# Patient Record
Sex: Female | Born: 1985 | Race: Black or African American | Hispanic: No | Marital: Married | Smoking: Never smoker
Health system: Southern US, Community
[De-identification: ages and names within clinical notes are randomized; demographics above are authoritative.]

## PROBLEM LIST (undated history)

## (undated) DIAGNOSIS — D259 Leiomyoma of uterus, unspecified: Secondary | ICD-10-CM

## (undated) DIAGNOSIS — K759 Inflammatory liver disease, unspecified: Secondary | ICD-10-CM

## (undated) HISTORY — DX: Inflammatory liver disease, unspecified: K75.9

---

## 2020-04-23 ENCOUNTER — Ambulatory Visit (INDEPENDENT_AMBULATORY_CARE_PROVIDER_SITE_OTHER): Payer: Self-pay

## 2020-04-23 VITALS — Ht 68.0 in

## 2020-04-23 DIAGNOSIS — Z3A19 19 weeks gestation of pregnancy: Secondary | ICD-10-CM

## 2020-04-23 DIAGNOSIS — Z3400 Encounter for supervision of normal first pregnancy, unspecified trimester: Secondary | ICD-10-CM

## 2020-04-23 DIAGNOSIS — Z3490 Encounter for supervision of normal pregnancy, unspecified, unspecified trimester: Secondary | ICD-10-CM | POA: Insufficient documentation

## 2020-04-23 DIAGNOSIS — Z3A00 Weeks of gestation of pregnancy not specified: Secondary | ICD-10-CM

## 2020-04-23 NOTE — Progress Notes (Signed)
Patient was assessed and managed by nursing staff during this encounter. I have reviewed the chart and agree with the documentation and plan. I have also made any necessary editorial changes.  Verita Schneiders, MD 04/23/2020 11:45 AM

## 2020-04-23 NOTE — Progress Notes (Signed)
  Virtual Visit via Telephone Note  I connected with Baelyn Santellan on 04/23/20 at  9:00 AM EDT by telephone and verified that I am speaking with the correct person using two identifiers.  Location: Patient: Home Provider: Merla Riches    I discussed the limitations, risks, security and privacy concerns of performing an evaluation and management service by telephone and the availability of in person appointments. I also discussed with the patient that there may be a patient responsible charge related to this service. The patient expressed understanding and agreed to proceed.   History of Present Illness: PRENATAL INTAKE SUMMARY  Ms. Ruberg presents today New OB Nurse Interview.  OB History    Gravida  1   Para      Term      Preterm      AB      Living        SAB      IAB      Ectopic      Multiple      Live Births             I have reviewed the patient's medical, obstetrical, social, and family histories, medications, and available lab results.  SUBJECTIVE She has no unusual complaints   Observations/Objective: Initial nurse interview for history/labs (New OB)  EDD: 09/15/2020 GA: [redacted]w[redacted]d G1/P0 FHT: non face to face interview  GENERAL APPEARANCE: non face to face interview  Assessment and Plan: Normal pregnancy Prenatal care: [facility] at [place]  Labs/exam to be completed at next visit Download Babyscripts Sign up for Mychart  PHQ9 = 7 - pt declined counseling services  GAD= 1   Follow Up Instructions:   I discussed the assessment and treatment plan with the patient. The patient was provided an opportunity to ask questions and all were answered. The patient agreed with the plan and demonstrated an understanding of the instructions.   The patient was advised to call back or seek an in-person evaluation if the symptoms worsen or if the condition fails to improve as anticipated.  I provided 15 minutes of non-face-to-face time during this  encounter.   Maryruth Eve, CMA

## 2020-04-30 ENCOUNTER — Other Ambulatory Visit (HOSPITAL_COMMUNITY)
Admission: RE | Admit: 2020-04-30 | Discharge: 2020-04-30 | Disposition: A | Discharge status: Home / Self Care | Payer: Self-pay | Source: Ambulatory Visit | Attending: Obstetrics | Admitting: Obstetrics

## 2020-04-30 ENCOUNTER — Other Ambulatory Visit: Payer: Self-pay

## 2020-04-30 ENCOUNTER — Ambulatory Visit (INDEPENDENT_AMBULATORY_CARE_PROVIDER_SITE_OTHER): Payer: Self-pay | Admitting: Obstetrics

## 2020-04-30 ENCOUNTER — Encounter: Payer: Self-pay | Admitting: Obstetrics

## 2020-04-30 VITALS — BP 113/78 | HR 85 | Wt 192.0 lb

## 2020-04-30 DIAGNOSIS — Z3480 Encounter for supervision of other normal pregnancy, unspecified trimester: Secondary | ICD-10-CM | POA: Insufficient documentation

## 2020-04-30 DIAGNOSIS — D259 Leiomyoma of uterus, unspecified: Secondary | ICD-10-CM

## 2020-04-30 DIAGNOSIS — O3410 Maternal care for benign tumor of corpus uteri, unspecified trimester: Secondary | ICD-10-CM

## 2020-04-30 NOTE — Progress Notes (Signed)
Pt declines genetic testing and afp today.  Pt complains of back and pelvic pain.

## 2020-04-30 NOTE — Progress Notes (Signed)
Subjective:    Kerri Black is being seen today for her first obstetrical visit.  This is a planned pregnancy. She is at [redacted]w[redacted]d gestation. Her obstetrical history is significant for none. Relationship with FOB: spouse, living together. Patient does not intend to breast feed. Pregnancy history fully reviewed.  The information documented in the HPI was reviewed and verified.  Menstrual History: OB History    Gravida  1   Para      Term      Preterm      AB      Living        SAB      IAB      Ectopic      Multiple      Live Births               Patient's last menstrual period was 12/10/2019.    Past Medical History:  Diagnosis Date  . Hepatitis    hep b per pt    History reviewed. No pertinent surgical history.  (Not in a hospital admission)  No Known Allergies  Social History   Tobacco Use  . Smoking status: Never Smoker  . Smokeless tobacco: Never Used  Substance Use Topics  . Alcohol use: Never    History reviewed. No pertinent family history.   Review of Systems Constitutional: negative for weight loss Gastrointestinal: negative for vomiting Genitourinary:negative for genital lesions and vaginal discharge and dysuria Musculoskeletal:negative for back pain Behavioral/Psych: negative for abusive relationship, depression, illegal drug usage and tobacco use    Objective:    BP 113/78   Pulse 85   Wt 192 lb (87.1 kg)   LMP 12/10/2019   BMI 29.19 kg/m  General Appearance:    Alert, cooperative, no distress, appears stated age  Head:    Normocephalic, without obvious abnormality, atraumatic  Eyes:    PERRL, conjunctiva/corneas clear, EOM's intact, fundi    benign, both eyes  Ears:    Normal TM's and external ear canals, both ears  Nose:   Nares normal, septum midline, mucosa normal, no drainage    or sinus tenderness  Throat:   Lips, mucosa, and tongue normal; teeth and gums normal  Neck:   Supple, symmetrical, trachea midline, no  adenopathy;    thyroid:  no enlargement/tenderness/nodules; no carotid   bruit or JVD  Back:     Symmetric, no curvature, ROM normal, no CVA tenderness  Lungs:     Clear to auscultation bilaterally, respirations unlabored  Chest Wall:    No tenderness or deformity   Heart:    Regular rate and rhythm, S1 and S2 normal, no murmur, rub   or gallop  Breast Exam:    No tenderness, masses, or nipple abnormality  Abdomen:     Soft, non-tender, bowel sounds active all four quadrants,    no masses, no organomegaly  Genitalia:    Normal female without lesion, discharge or tenderness  Extremities:   Extremities normal, atraumatic, no cyanosis or edema  Pulses:   2+ and symmetric all extremities  Skin:   Skin color, texture, turgor normal, no rashes or lesions  Lymph nodes:   Cervical, supraclavicular, and axillary nodes normal  Neurologic:   CNII-XII intact, normal strength, sensation and reflexes    throughout      Lab Review Urine pregnancy test Labs reviewed yes Radiologic studies reviewed no Assessment:    Pregnancy at [redacted]w[redacted]d weeks    Plan:     1. Supervision  of other normal pregnancy, antepartum Rx: - Cytology - PAP( [place]) - Cervicovaginal ancillary only( [place]) - CBC/D/Plt+RPR+Rh+ABO+Rub Ab... - Culture, OB Urine  2. Uterine fibroid in pregnancy - clinically stable  Prenatal vitamins.  Counseling provided regarding continued use of seat belts, cessation of alcohol consumption, smoking or use of illicit drugs; infection precautions i.e., influenza/TDAP immunizations, toxoplasmosis,CMV, parvovirus, listeria and varicella; workplace safety, exercise during pregnancy; routine dental care, safe medications, sexual activity, hot tubs, saunas, pools, travel, caffeine use, fish and methlymercury, potential toxins, hair treatments, varicose veins Weight gain recommendations per IOM guidelines reviewed: underweight/BMI< 18.5--> gain 28 - 40 lbs; normal weight/BMI 18.5 - 24.9-->  gain 25 - 35 lbs; overweight/BMI 25 - 29.9--> gain 15 - 25 lbs; obese/BMI >30->gain  11 - 20 lbs Problem list reviewed and updated. FIRST/CF mutation testing/NIPT/QUAD SCREEN/fragile X/Ashkenazi Jewish population testing/Spinal muscular atrophy discussed: declined. Role of ultrasound in pregnancy discussed; fetal survey: declined. Amniocentesis discussed: not indicated.   Orders Placed This Encounter  Procedures  . Culture, OB Urine  . CBC/D/Plt+RPR+Rh+ABO+Rub Ab...    Follow up in 4 weeks.  I have spent a total of 25 minutes of face-to-face time, excluding clinical staff time, reviewing notes and preparing to see patient, ordering tests and/or medications, and counseling the patient.  Shelly Bombard, MD 04/30/2020 12:00 PM

## 2020-05-02 LAB — URINE CULTURE, OB REFLEX

## 2020-05-02 LAB — CULTURE, OB URINE

## 2020-05-03 LAB — CERVICOVAGINAL ANCILLARY ONLY
Bacterial Vaginitis (gardnerella): NEGATIVE
Candida Glabrata: NEGATIVE
Candida Vaginitis: NEGATIVE
Chlamydia: NEGATIVE
Comment: NEGATIVE
Comment: NEGATIVE
Comment: NEGATIVE
Comment: NEGATIVE
Comment: NEGATIVE
Comment: NORMAL
Neisseria Gonorrhea: NEGATIVE
Trichomonas: NEGATIVE

## 2020-05-03 LAB — CYTOLOGY - PAP
Comment: NEGATIVE
Diagnosis: NEGATIVE
High risk HPV: NEGATIVE

## 2020-05-04 ENCOUNTER — Other Ambulatory Visit: Payer: Self-pay | Admitting: Obstetrics

## 2020-05-04 LAB — CBC/D/PLT+RPR+RH+ABO+RUB AB...
Antibody Screen: NEGATIVE
Basophils Absolute: 0 10*3/uL (ref 0.0–0.2)
Basos: 0 %
EOS (ABSOLUTE): 0.1 10*3/uL (ref 0.0–0.4)
Eos: 1 %
HCV Ab: 0.2 s/co ratio (ref 0.0–0.9)
HIV Screen 4th Generation wRfx: NONREACTIVE
Hematocrit: 38.6 % (ref 34.0–46.6)
Hemoglobin: 13.3 g/dL (ref 11.1–15.9)
Immature Grans (Abs): 0 10*3/uL (ref 0.0–0.1)
Immature Granulocytes: 0 %
Lymphocytes Absolute: 2.4 10*3/uL (ref 0.7–3.1)
Lymphs: 29 %
MCH: 30.4 pg (ref 26.6–33.0)
MCHC: 34.5 g/dL (ref 31.5–35.7)
MCV: 88 fL (ref 79–97)
Monocytes Absolute: 0.7 10*3/uL (ref 0.1–0.9)
Monocytes: 9 %
Neutrophils Absolute: 5.1 10*3/uL (ref 1.4–7.0)
Neutrophils: 61 %
Platelets: 284 10*3/uL (ref 150–450)
RBC: 4.37 x10E6/uL (ref 3.77–5.28)
RDW: 13.4 % (ref 11.7–15.4)
RPR Ser Ql: NONREACTIVE
Rh Factor: POSITIVE
Rubella Antibodies, IGG: 7.88 index (ref >0.99)
WBC: 8.3 10*3/uL (ref 3.4–10.8)

## 2020-05-04 LAB — HEPATITIS B SURFACE AG, CONFIRM: HBsAG Confirmation: POSITIVE — AB

## 2020-05-04 LAB — HCV INTERPRETATION

## 2020-05-28 ENCOUNTER — Ambulatory Visit (INDEPENDENT_AMBULATORY_CARE_PROVIDER_SITE_OTHER): Payer: Self-pay

## 2020-05-28 ENCOUNTER — Other Ambulatory Visit: Payer: Self-pay

## 2020-05-28 VITALS — BP 112/82 | HR 74 | Wt 202.0 lb

## 2020-05-28 DIAGNOSIS — Z3A24 24 weeks gestation of pregnancy: Secondary | ICD-10-CM

## 2020-05-28 DIAGNOSIS — Z3400 Encounter for supervision of normal first pregnancy, unspecified trimester: Secondary | ICD-10-CM

## 2020-05-28 NOTE — Progress Notes (Signed)
   LOW-RISK PREGNANCY OFFICE VISIT  Patient name: Kerri Black MRN [id number]  Date of birth: 1985/08/17 Chief Complaint:   Routine Prenatal Visit  Subjective:   Kerri Black is a 35 y.o. G1P0 female at [redacted]w[redacted]d with an Estimated Date of Delivery: 09/15/20 being seen today for ongoing management of a low-risk pregnancy aeb has Encounter for supervision of normal pregnancy, antepartum on their problem list.  Patient presents today with c/o vaginal pressure and back pain.  Patient endorses fetal movement. Patient denies abdominal cramping or contractions.  Patient denies vaginal concerns including abnormal discharge, leaking of fluid, and bleeding.  Contractions: Not present. Vag. Bleeding: None.  Movement: Present.  Reviewed past medical,surgical, social, obstetrical and family history as well as problem list, medications and allergies.  Objective   Vitals:   05/28/20 1016  BP: 112/82  Pulse: 74  Weight: 202 lb (91.6 kg)  Body mass index is 30.71 kg/m.  Total Weight Gain:30 lb (13.6 kg)         Physical Examination:   General appearance: Well appearing, and in no distress  Mental status: Alert, oriented to person, place, and time  Skin: Warm & dry  Cardiovascular: Normal heart rate noted  Respiratory: Normal respiratory effort, no distress  Abdomen: Soft, gravid, nontender, LGA with Fundal Height: 30 cm and known fibroids  Pelvic: Cervical exam deferred           Extremities: Edema: None  Fetal Status: Fetal Heart Rate (bpm): 156  Movement: Present   No results found for this or any previous visit (from the past 24 hour(s)).  Assessment & Plan:  Low-risk pregnancy of a 35 y.o., G1P0 at [redacted]w[redacted]d with an Estimated Date of Delivery: 09/15/20   1. Supervision of normal first pregnancy, antepartum -Anticipatory guidance for upcoming appts. -Patient to schedule next appt in 4 weeks for an in-person visit. -Reviewed Glucola appt preparation including fasting the night  before and morning of.   -Discussed anticipated office time of 2.5-3 hours.  -Reviewed blood draw procedures and labs which also include check of iron level, HIV, and RPR. -Reviewed [place] which was normal, but incomplete. -Request and order sent for anatomy completion to [facility]. -Patient states she did not receive [place] pics of infant.  Encouraged to advocate for pics at next visit.   2. [redacted] weeks gestation of pregnancy -Doing well overall. -Reviewed complaints and reassured that normal physiological changes that will worsen as pregnancy progresses. -Discussed usage of maternity support band/belt.     Meds: No orders of the defined types were placed in this encounter.  Labs/procedures today:  Lab Orders  No laboratory test(s) ordered today     Reviewed: Preterm labor symptoms and general obstetric precautions including but not limited to vaginal bleeding, contractions, leaking of fluid and fetal movement were reviewed in detail with the patient.  All questions were answered.  Follow-up: Return in about 4 weeks (around 06/25/2020) for [place] with GTT.  No orders of the defined types were placed in this encounter.  Maryann Conners MSN, CNM 05/28/2020

## 2020-05-28 NOTE — Patient Instructions (Addendum)
Glucose Tolerance Test Why am I having this test? The glucose tolerance test (GTT) is done to check how your body processes sugar (glucose). This is one of several tests used to diagnose diabetes (diabetes mellitus). Your health care provider may recommend this test if you:  Have a family history of diabetes.  Are obese.  Have infections that keep coming back.  Have had a lot of wounds that did not heal quickly, especially on your legs and feet.  Are a woman and have a history of giving birth to very large babies or a history of repeated fetal loss (stillbirth).  Have had high glucose levels in your urine or blood: ? During a past pregnancy. ? After a heart attack, surgery, or prolonged periods of high stress. What is being tested? This test measures the amount of glucose in your blood at different times during a period of 2 hours. This indicates how well your body is able to process glucose. What kind of sample is taken? Blood samples are required for this test. They are usually collected by inserting a needle into a blood vessel.   How do I prepare for this test?  For 3 days before your test, eat normally. Have plenty of carbohydrate-rich foods.  Follow instructions from your health care provider about: ? Eating or drinking restrictions on the day of the test. You may be asked to not eat or drink anything other than water (fast) starting 8-12 hours before the test. ? Changing or stopping your regular medicines. Some medicines may interfere with this test. Tell a health care provider about:  All medicines you are taking, including vitamins, herbs, eye drops, creams, and over-the-counter medicines.  Any blood disorders you have.  Any surgeries you have had.  Any medical conditions you have.  Whether you are pregnant or may be pregnant. What happens during the test? First, your blood glucose will be measured. This is referred to as your fasting blood glucose, since you  fasted before the test. Then, you will drink a glucose solution that contains a specific amount of glucose. Your blood glucose will be measured again 1 and 2 hours after drinking the solution. This test takes 2 hours to complete. You will need to stay at the testing location during this time. During the testing period:  Do not eat or drink anything other than the glucose solution. You will be allowed to drink water.  Do not exercise.  Do not use any products that contain nicotine or tobacco. These products include cigarettes, chewing tobacco, and vaping devices, such as e-cigarettes. If you need help quitting, ask your health care provider. The testing procedure may vary among health care providers and hospitals. How are the results reported? Your test results will be reported as values. These will be given as milligrams of glucose per deciliter of blood (mg/dL) or millimoles per liter (mmol/L). Your health care provider will compare your results to normal ranges that were established after testing a large group of people (reference ranges). Reference ranges may vary among labs and hospitals. For this test, common reference ranges are:  Fasting: less than 110 mg/dL (6.1 mmol/L).  1 hour after drinking glucose: less than 180 mg/dL (10.0 mmol/L).  2 hours after drinking glucose: less than 140 mg/dL (7.8 mmol/L). What do the results mean? Results that are within the reference ranges are considered normal, meaning that your glucose levels are well controlled. Results higher than the reference ranges may mean that you recently experienced  stress, such as from an injury or a sudden (acute) condition like a heart attack or stroke, or that you have:  Diabetes.  Cushing syndrome.  Tumors such as pheochromocytoma or glucagonoma.  Kidney failure.  Pancreatitis.  Hyperthyroidism.  An infection. Talk with your health care provider about what your results mean. Questions to ask your health care  provider Ask your health care provider, or the department that is doing the test:  When will my results be ready?  How will I get my results?  What are my treatment options?  What other tests do I need?  What are my next steps? Summary  The GTT is done to check how your body processes glucose. This is one of several tests used to diagnose diabetes.  This test measures the amount of glucose in your blood at different times during a period of 2 hours. This indicates how well your body is able to process glucose.  Talk with your health care provider about what your results mean. This information is not intended to replace advice given to you by your health care provider. Make sure you discuss any questions you have with your health care provider. Document Revised: 09/17/2019 Document Reviewed: 09/17/2019 Elsevier Patient Education  2021 [employer].

## 2020-05-28 NOTE — Progress Notes (Signed)
[place] results are in Media.

## 2020-06-25 ENCOUNTER — Other Ambulatory Visit: Payer: Self-pay

## 2020-06-25 ENCOUNTER — Ambulatory Visit (INDEPENDENT_AMBULATORY_CARE_PROVIDER_SITE_OTHER): Payer: Self-pay | Admitting: Obstetrics and Gynecology

## 2020-06-25 VITALS — BP 112/73 | HR 94 | Wt 206.0 lb

## 2020-06-25 DIAGNOSIS — B1910 Unspecified viral hepatitis B without hepatic coma: Secondary | ICD-10-CM

## 2020-06-25 DIAGNOSIS — Z3400 Encounter for supervision of normal first pregnancy, unspecified trimester: Secondary | ICD-10-CM

## 2020-06-25 DIAGNOSIS — O98419 Viral hepatitis complicating pregnancy, unspecified trimester: Secondary | ICD-10-CM

## 2020-06-25 DIAGNOSIS — Z23 Encounter for immunization: Secondary | ICD-10-CM

## 2020-06-25 MED ORDER — CYCLOBENZAPRINE HCL 10 MG PO TABS: 10.0000 mg | ORAL_TABLET | Freq: Three times a day (TID) | ORAL | 1 refills | Status: DC | PRN

## 2020-06-25 NOTE — Progress Notes (Signed)
Pt states increase in back pain and pelvic pressure.  Pt states that she has a support belt but has not used very much.   PHQ-9 score 0 today.

## 2020-06-25 NOTE — Progress Notes (Signed)
   PRENATAL VISIT NOTE  Subjective:  Kerri Black is a 35 y.o. G1P0 at [redacted]w[redacted]d being seen today for ongoing prenatal care.  She is currently monitored for the following issues for this low-risk pregnancy and has Encounter for supervision of normal pregnancy, antepartum and Hepatitis B affecting pregnancy on their problem list.  Patient reports no complaints.  Contractions: Not present. Vag. Bleeding: None.  Movement: Present. Denies leaking of fluid.   The following portions of the patient's history were reviewed and updated as appropriate: allergies, current medications, past family history, past medical history, past social history, past surgical history and problem list.   Objective:   Vitals:   06/25/20 0843  BP: 112/73  Pulse: 94  Weight: 206 lb (93.4 kg)    Fetal Status: Fetal Heart Rate (bpm): 145 Fundal Height: 30 cm Movement: Present     General:  Alert, oriented and cooperative. Patient is in no acute distress.  Skin: Skin is warm and dry. No rash noted.   Cardiovascular: Normal heart rate noted  Respiratory: Normal respiratory effort, no problems with respiration noted  Abdomen: Soft, gravid, appropriate for gestational age.  Pain/Pressure: Present     Pelvic: Cervical exam deferred        Extremities: Normal range of motion.     Mental Status: Normal mood and affect. Normal behavior. Normal judgment and thought content.   Assessment and Plan:  Pregnancy: G1P0 at [redacted]w[redacted]d 1. Supervision of normal first pregnancy, antepartum  - Glucose Tolerance, 2 Hours w/1 Hour - CBC - HIV Antibody (routine testing w rflx) - RPR - Comprehensive metabolic panel - HAV, HBV Panel - Had US done at [facility] @ 17 weeks, however this was not done as an anatomy scan. Another order placed for anatomy scan and growth.     Hepatitis B  If HepBsAg is positive-->Check HBeAg, anti-HBe, HBV DNA, LFT's  If HBV DNA > 2 x 105 or HBeAg +, or elevated LFTs-->refer to hepatologist  If HBV  DNA > 2 x 104, HBeAg-, and abnl LFT's-->refer to hepatologist   Check LFT's q 3 months up to 6 months pp  Check HBV DNA q 3 months, if LFT's are up or at 26-28 wks-->if increasing here, consider referral for treatment  In high risk women who are negative for HepBsAg-->check HepBsAb and HepBCore Ab-if no evidence of immunity or prior exposure then vaccinate patient   Preterm labor symptoms and general obstetric precautions including but not limited to vaginal bleeding, contractions, leaking of fluid and fetal movement were reviewed in detail with the patient. Please refer to After Visit Summary for other counseling recommendations.   Return in about 2 weeks (around 07/09/2020), or MD only.  Future Appointments  Date Time Provider [place]  07/09/2020  8:30 AM Shelly Bombard, MD [place] None    Noni Saupe, NP

## 2020-06-27 DIAGNOSIS — B1910 Unspecified viral hepatitis B without hepatic coma: Secondary | ICD-10-CM | POA: Insufficient documentation

## 2020-06-27 DIAGNOSIS — O98419 Viral hepatitis complicating pregnancy, unspecified trimester: Secondary | ICD-10-CM | POA: Insufficient documentation

## 2020-06-29 LAB — CBC
Hematocrit: 34.5 % (ref 34.0–46.6)
Hemoglobin: 11.5 g/dL (ref 11.1–15.9)
MCH: 29 pg (ref 26.6–33.0)
MCHC: 33.3 g/dL (ref 31.5–35.7)
MCV: 87 fL (ref 79–97)
Platelets: 309 10*3/uL (ref 150–450)
RBC: 3.97 x10E6/uL (ref 3.77–5.28)
RDW: 13 % (ref 11.7–15.4)
WBC: 8.2 10*3/uL (ref 3.4–10.8)

## 2020-06-29 LAB — COMPREHENSIVE METABOLIC PANEL
ALT: 10 IU/L (ref 0–32)
AST: 12 IU/L (ref 0–40)
Albumin/Globulin Ratio: 1.3 (ref 1.2–2.2)
Albumin: 3.5 g/dL — ABNORMAL LOW (ref 3.8–4.8)
Alkaline Phosphatase: 96 IU/L (ref 44–121)
BUN/Creatinine Ratio: 13 (ref 9–23)
BUN: 7 mg/dL (ref 6–20)
Bilirubin Total: <0.2 mg/dL (ref 0.0–1.2)
CO2: 18 mmol/L — ABNORMAL LOW (ref 20–29)
Calcium: 9 mg/dL (ref 8.7–10.2)
Chloride: 104 mmol/L (ref 96–106)
Creatinine, Ser: 0.56 mg/dL — ABNORMAL LOW (ref 0.57–1.00)
Globulin, Total: 2.8 g/dL (ref 1.5–4.5)
Glucose: 149 mg/dL — ABNORMAL HIGH (ref 65–99)
Potassium: 3.6 mmol/L (ref 3.5–5.2)
Sodium: 136 mmol/L (ref 134–144)
Total Protein: 6.3 g/dL (ref 6.0–8.5)
eGFR: 124 mL/min/{1.73_m2} (ref >59)

## 2020-06-29 LAB — HBCIGM: Hep B C IgM: NEGATIVE

## 2020-06-29 LAB — HAV, HBV PANEL
Hep B Core Total Ab: POSITIVE — AB
Hep B Surface Ab, Qual: NONREACTIVE
Hepatitis B Surface Ag: POSITIVE — AB
hep A Total Ab: POSITIVE — AB

## 2020-06-29 LAB — HIV ANTIBODY (ROUTINE TESTING W REFLEX): HIV Screen 4th Generation wRfx: NONREACTIVE

## 2020-06-29 LAB — HEPATITIS A ANTIBODY, IGM: Hep A IgM: NEGATIVE

## 2020-06-29 LAB — GLUCOSE TOLERANCE, 2 HOURS W/ 1HR
Glucose, 1 hour: 149 mg/dL (ref 65–179)
Glucose, 2 hour: 136 mg/dL (ref 65–152)
Glucose, Fasting: 79 mg/dL (ref 65–91)

## 2020-06-29 LAB — RPR: RPR Ser Ql: NONREACTIVE

## 2020-07-09 ENCOUNTER — Encounter: Payer: Self-pay | Admitting: Obstetrics

## 2020-07-09 ENCOUNTER — Ambulatory Visit (INDEPENDENT_AMBULATORY_CARE_PROVIDER_SITE_OTHER): Payer: Self-pay | Admitting: Obstetrics and Gynecology

## 2020-07-09 ENCOUNTER — Encounter: Payer: Self-pay | Admitting: Obstetrics and Gynecology

## 2020-07-09 ENCOUNTER — Other Ambulatory Visit: Payer: Self-pay

## 2020-07-09 VITALS — BP 121/76 | HR 96 | Wt 213.0 lb

## 2020-07-09 DIAGNOSIS — O98419 Viral hepatitis complicating pregnancy, unspecified trimester: Secondary | ICD-10-CM

## 2020-07-09 DIAGNOSIS — Z3400 Encounter for supervision of normal first pregnancy, unspecified trimester: Secondary | ICD-10-CM

## 2020-07-09 DIAGNOSIS — Z3A30 30 weeks gestation of pregnancy: Secondary | ICD-10-CM

## 2020-07-09 DIAGNOSIS — B1910 Unspecified viral hepatitis B without hepatic coma: Secondary | ICD-10-CM

## 2020-07-09 NOTE — Progress Notes (Signed)
   PRENATAL VISIT NOTE  Subjective:  Kerri Black is a 35 y.o. G1P0 at [redacted]w[redacted]d being seen today for ongoing prenatal care.  She is currently monitored for the following issues for this high-risk pregnancy and has Encounter for supervision of normal pregnancy, antepartum; Hepatitis B affecting pregnancy; and [redacted] weeks gestation of pregnancy on their problem list.  Patient doing well with no acute concerns today. She reports no complaints.  Contractions: Not present. Vag. Bleeding: None.  Movement: Present. Denies leaking of fluid.   Some previous labwork on the HepB panel had not resulted, panel reordered today  The following portions of the patient's history were reviewed and updated as appropriate: allergies, current medications, past family history, past medical history, past social history, past surgical history and problem list. Problem list updated.  Objective:   Vitals:   07/09/20 1114  BP: 121/76  Pulse: 96  Weight: 213 lb (96.6 kg)    Fetal Status: Fetal Heart Rate (bpm): 154   Movement: Present     General:  Alert, oriented and cooperative. Patient is in no acute distress.  Skin: Skin is warm and dry. No rash noted.   Cardiovascular: Normal heart rate noted  Respiratory: Normal respiratory effort, no problems with respiration noted  Abdomen: Soft, gravid, appropriate for gestational age.  Pain/Pressure: Absent     Pelvic: Cervical exam deferred        Extremities: Normal range of motion.  Edema: None  Mental Status:  Normal mood and affect. Normal behavior. Normal judgment and thought content.   Assessment and Plan:  Pregnancy: G1P0 at [redacted]w[redacted]d  1. Supervision of normal first pregnancy, antepartum Continue routine care  2. [redacted] weeks gestation of pregnancy   3. Hepatitis B affecting pregnancy See 06/25/20 note - Hepatitis B DNA, Ultraquantitative, PCR - Hepatitis B E Antibody - Hepatitis B E Antigen  Preterm labor symptoms and general obstetric precautions  including but not limited to vaginal bleeding, contractions, leaking of fluid and fetal movement were reviewed in detail with the patient.  Please refer to After Visit Summary for other counseling recommendations.   Return in about 2 weeks (around 07/23/2020) for ROB, in person.   Lynnda Shields, MD [facility]

## 2020-07-09 NOTE — Progress Notes (Signed)
ROB  [redacted]w[redacted]d  CC: None

## 2020-07-12 LAB — HEPATITIS B E ANTIGEN: Hep B E Ag: NEGATIVE

## 2020-07-12 LAB — HEPATITIS B DNA, ULTRAQUANTITATIVE, PCR: HBV DNA SERPL PCR-ACNC: <10 IU/mL

## 2020-07-12 LAB — HEPATITIS B E ANTIBODY: Hep B E Ab: NEGATIVE

## 2020-07-23 ENCOUNTER — Other Ambulatory Visit: Payer: Self-pay

## 2020-07-23 ENCOUNTER — Ambulatory Visit (INDEPENDENT_AMBULATORY_CARE_PROVIDER_SITE_OTHER): Payer: Self-pay | Admitting: Obstetrics and Gynecology

## 2020-07-23 ENCOUNTER — Encounter: Payer: Self-pay | Admitting: Obstetrics and Gynecology

## 2020-07-23 VITALS — BP 110/77 | HR 84 | Wt 213.4 lb

## 2020-07-23 DIAGNOSIS — Z3400 Encounter for supervision of normal first pregnancy, unspecified trimester: Secondary | ICD-10-CM

## 2020-07-23 DIAGNOSIS — B1910 Unspecified viral hepatitis B without hepatic coma: Secondary | ICD-10-CM

## 2020-07-23 DIAGNOSIS — O98419 Viral hepatitis complicating pregnancy, unspecified trimester: Secondary | ICD-10-CM

## 2020-07-23 NOTE — Patient Instructions (Signed)
[email or web address]">  Third Trimester of Pregnancy  The third trimester of pregnancy is from week 28 through week 40. This is months 7 through 9. The third trimester is a time when the unborn baby (fetus) is growing rapidly. At the end of the ninth month, the fetus is about 20inches long and weighs 6-10 pounds. Body changes during your third trimester During the third trimester, your body will continue to go through many changes.The changes vary and generally return to normal after your baby is born. Physical changes Your weight will continue to increase. You can expect to gain 25-35 pounds (11-16 kg) by the end of the pregnancy if you begin pregnancy at a normal weight. If you are underweight, you can expect to gain 28-40 lb (about 13-18 kg), and if you are overweight, you can expect to gain 15-25 lb (about 7-11 kg). You may begin to get stretch marks on your hips, abdomen, and breasts. Your breasts will continue to grow and may hurt. A yellow fluid (colostrum) may leak from your breasts. This is the first milk you are producing for your baby. You may have changes in your hair. These can include thickening of your hair, rapid growth, and changes in texture. Some people also have hair loss during or after pregnancy, or hair that feels dry or thin. Your belly button may stick out. You may notice more swelling in your hands, face, or ankles. Health changes You may have heartburn. You may have constipation. You may develop hemorrhoids. You may develop swollen, bulging veins (varicose veins) in your legs. You may have increased body aches in the pelvis, back, or thighs. This is due to weight gain and increased hormones that are relaxing your joints. You may have increased tingling or numbness in your hands, arms, and legs. The skin on your abdomen may also feel numb. You may feel short of breath because of your expanding uterus. Other  changes You may urinate more often because the fetus is moving lower into your pelvis and pressing on your bladder. You may have more problems sleeping. This may be caused by the size of your abdomen, an increased need to urinate, and an increase in your body's metabolism. You may notice the fetus "dropping," or moving lower in your abdomen (lightening). You may have increased vaginal discharge. You may notice that you have pain around your pelvic bone as your uterus distends. Follow these instructions at home: Medicines Follow your health care provider's instructions regarding medicine use. Specific medicines may be either safe or unsafe to take during pregnancy. Do not take any medicines unless approved by your health care provider. Take a prenatal vitamin that contains at least 600 micrograms (mcg) of folic acid. Eating and drinking Eat a healthy diet that includes fresh fruits and vegetables, whole grains, good sources of protein such as meat, eggs, or tofu, and low-fat dairy products. Avoid raw meat and unpasteurized juice, milk, and cheese. These carry germs that can harm you and your baby. Eat 4 or 5 small meals rather than 3 large meals a day. You may need to take these actions to prevent or treat constipation: Drink enough fluid to keep your urine pale yellow. Eat foods that are high in fiber, such as beans, whole grains, and fresh fruits and vegetables. Limit foods that are high in fat and processed sugars, such as fried or sweet foods. Activity Exercise only as directed by your health care provider. Most people can continue their usual exercise routine during pregnancy. Try  to exercise for 30 minutes at least 5 days a week. Stop exercising if you experience contractions in the uterus. Stop exercising if you develop pain or cramping in the lower abdomen or lower back. Avoid heavy lifting. Do not exercise if it is very hot or humid or if you are at a high altitude. If you choose to,  you may continue to have sex unless your health care provider tells you not to. Relieving pain and discomfort Take frequent breaks and rest with your legs raised (elevated) if you have leg cramps or low back pain. Take warm sitz baths to soothe any pain or discomfort caused by hemorrhoids. Use hemorrhoid cream if your health care provider approves. Wear a supportive bra to prevent discomfort from breast tenderness. If you develop varicose veins: Wear support hose as told by your health care provider. Elevate your feet for 15 minutes, 3-4 times a day. Limit salt in your diet. Safety Talk to your health care provider before traveling far distances. Do not use hot tubs, steam rooms, or saunas. Wear your seat belt at all times when driving or riding in a car. Talk with your health care provider if someone is verbally or physically abusive to you. Preparing for birth To prepare for the arrival of your baby: Take prenatal classes to understand, practice, and ask questions about labor and delivery. Visit the hospital and tour the maternity area. Purchase a rear-facing car seat and make sure you know how to install it in your car. Prepare the baby's room or sleeping area. Make sure to remove all pillows and stuffed animals from the baby's crib to prevent suffocation. General instructions Avoid cat litter boxes and soil used by cats. These carry germs that can cause birth defects in the baby. If you have a cat, ask someone to clean the litter box for you. Do not douche or use tampons. Do not use scented sanitary pads. Do not use any products that contain nicotine or tobacco, such as cigarettes, e-cigarettes, and chewing tobacco. If you need help quitting, ask your health care provider. Do not use any herbal remedies, illegal drugs, or medicines that were not prescribed to you. Chemicals in these products can harm your baby. Do not drink alcohol. You will have more frequent prenatal exams during the  third trimester. During a routine prenatal visit, your health care provider will do a physical exam, perform tests, and discuss your overall health. Keep all follow-up visits. This is important. Where to find more information American Pregnancy Association: americanpregnancy.[place] and Gynecologists: PoolDevices.com.pt Office on [employer]: KeywordPortfolios.com.br Contact a health care provider if you have: A fever. Mild pelvic cramps, pelvic pressure, or nagging pain in your abdominal area or lower back. Vomiting or diarrhea. Bad-smelling vaginal discharge or foul-smelling urine. Pain when you urinate. A headache that does not go away when you take medicine. Visual changes or see spots in front of your eyes. Get help right away if: Your water breaks. You have regular contractions less than 5 minutes apart. You have spotting or bleeding from your vagina. You have severe abdominal pain. You have difficulty breathing. You have chest pain. You have fainting spells. You have not felt your baby move for the time period told by your health care provider. You have new or increased pain, swelling, or redness in an arm or leg. Summary The third trimester of pregnancy is from week 28 through week 40 (months 7 through 9). You may have more problems  sleeping. This can be caused by the size of your abdomen, an increased need to urinate, and an increase in your body's metabolism. You will have more frequent prenatal exams during the third trimester. Keep all follow-up visits. This is important. This information is not intended to replace advice given to you by your health care provider. Make sure you discuss any questions you have with your healthcare provider. Document Revised: 05/28/2019 Document Reviewed: 04/03/2019 Elsevier Patient Education  2022 [employer].

## 2020-07-23 NOTE — Progress Notes (Signed)
Subjective:  Kerri Black is a 35 y.o. G1P0 at 1w2dbeing seen today for ongoing prenatal care.  She is currently monitored for the following issues for this high-risk pregnancy and has Encounter for supervision of normal pregnancy, antepartum and Hepatitis B affecting pregnancy on their problem list.  Patient reports no complaints.  Contractions: Not present. Vag. Bleeding: None.  Movement: Present. Denies leaking of fluid.   The following portions of the patient's history were reviewed and updated as appropriate: allergies, current medications, past family history, past medical history, past social history, past surgical history and problem list. Problem list updated.  Objective:   Vitals:   07/23/20 0811  BP: 110/77  Pulse: 84  Weight: 213 lb 6.4 oz (96.8 kg)    Fetal Status: Fetal Heart Rate (bpm): 140   Movement: Present     General:  Alert, oriented and cooperative. Patient is in no acute distress.  Skin: Skin is warm and dry. No rash noted.   Cardiovascular: Normal heart rate noted  Respiratory: Normal respiratory effort, no problems with respiration noted  Abdomen: Soft, gravid, appropriate for gestational age. Pain/Pressure: Absent     Pelvic:  Cervical exam deferred        Extremities: Normal range of motion.  Edema: None  Mental Status: Normal mood and affect. Normal behavior. Normal judgment and thought content.   Urinalysis:      Assessment and Plan:  Pregnancy: G1P0 at 326w2d1. Hepatitis B affecting pregnancy Stable Repeat labs in Sept  2. Supervision of normal first pregnancy, antepartum Stable  Preterm labor symptoms and general obstetric precautions including but not limited to vaginal bleeding, contractions, leaking of fluid and fetal movement were reviewed in detail with the patient. Please refer to After Visit Summary for other counseling recommendations.  Return in about 2 weeks (around 08/06/2020) for face to face, any provider, OB  visit.   ErChancy MilroyMD

## 2020-07-23 NOTE — Progress Notes (Signed)
   Patient in clinic for [place] visit. Normal vitals, fetal heart tones 140. Patient reported last fetal movement was last night, however as nurse listening to fetal heart tones the baby was kicking. Patient would like to discuss her most recent ultrasound report completed on 06/17/2020 [facility].   Derl Barrow, RN

## 2020-07-30 ENCOUNTER — Ambulatory Visit (INDEPENDENT_AMBULATORY_CARE_PROVIDER_SITE_OTHER): Payer: Self-pay | Admitting: Obstetrics & Gynecology

## 2020-07-30 VITALS — BP 120/81 | HR 86 | Wt 217.0 lb

## 2020-07-30 DIAGNOSIS — Z3400 Encounter for supervision of normal first pregnancy, unspecified trimester: Secondary | ICD-10-CM

## 2020-07-30 NOTE — Progress Notes (Signed)
   PRENATAL VISIT NOTE  Subjective:  Kerri Black is a 35 y.o. G1P0 at 56w2dbeing seen today for ongoing prenatal care.  She is currently monitored for the following issues for this low-risk pregnancy and has Encounter for supervision of normal pregnancy, antepartum and Hepatitis B affecting pregnancy on their problem list.  Patient reports  no FM 3 days .  Contractions: Not present. Vag. Bleeding: None.  Movement: (!) Decreased. Denies leaking of fluid.   The following portions of the patient's history were reviewed and updated as appropriate: allergies, current medications, past family history, past medical history, past social history, past surgical history and problem list.   Objective:   Vitals:   07/30/20 0953  BP: 120/81  Pulse: 86  Weight: 217 lb (98.4 kg)    Fetal Status: Fetal Heart Rate (bpm): NST   Movement: (!) Decreased     General:  Alert, oriented and cooperative. Patient is in no acute distress.  Skin: Skin is warm and dry. No rash noted.   Cardiovascular: Normal heart rate noted  Respiratory: Normal respiratory effort, no problems with respiration noted  Abdomen: Soft, gravid, appropriate for gestational age.  Pain/Pressure: Present     Pelvic: Cervical exam deferred        Extremities: Normal range of motion.  Edema: None  Mental Status: Normal mood and affect. Normal behavior. Normal judgment and thought content.   Assessment and Plan:  Pregnancy: G1P0 at 312w2d. Supervision of normal first pregnancy, antepartum NST reactive and normal FM noted  Preterm labor symptoms and general obstetric precautions including but not limited to vaginal bleeding, contractions, leaking of fluid and fetal movement were reviewed in detail with the patient. Please refer to After Visit Summary for other counseling recommendations.   Return in about 1 week (around 08/06/2020).  Future Appointments  Date Time Provider [place]8/05/2020  8:55 AM HaShelly BombardMD [facility] None    JaEmeterio ReeveMD

## 2020-07-30 NOTE — Progress Notes (Signed)
Pt came into the office today c/o decreased fetal movement for 3 days. She was placed on the NST.

## 2020-08-06 ENCOUNTER — Encounter: Payer: Self-pay | Admitting: Obstetrics

## 2020-08-06 ENCOUNTER — Ambulatory Visit (INDEPENDENT_AMBULATORY_CARE_PROVIDER_SITE_OTHER): Payer: Self-pay | Admitting: Obstetrics

## 2020-08-06 ENCOUNTER — Other Ambulatory Visit: Payer: Self-pay

## 2020-08-06 VITALS — BP 120/83 | HR 88 | Wt 220.0 lb

## 2020-08-06 DIAGNOSIS — O3410 Maternal care for benign tumor of corpus uteri, unspecified trimester: Secondary | ICD-10-CM

## 2020-08-06 DIAGNOSIS — Z3400 Encounter for supervision of normal first pregnancy, unspecified trimester: Secondary | ICD-10-CM

## 2020-08-06 DIAGNOSIS — B181 Chronic viral hepatitis B without delta-agent: Secondary | ICD-10-CM

## 2020-08-06 DIAGNOSIS — D259 Leiomyoma of uterus, unspecified: Secondary | ICD-10-CM

## 2020-08-06 NOTE — Progress Notes (Signed)
Subjective:  Kerri Black is a 34 y.o. G1P0 at 33w2dbeing seen today for ongoing prenatal care.  She is currently monitored for the following issues for this low-risk pregnancy and has Encounter for supervision of normal pregnancy, antepartum and Hepatitis B affecting pregnancy on their problem list.  Patient reports no complaints.  Contractions: Not present. Vag. Bleeding: None.  Movement: Present. Denies leaking of fluid.   The following portions of the patient's history were reviewed and updated as appropriate: allergies, current medications, past family history, past medical history, past social history, past surgical history and problem list. Problem list updated.  Objective:   Vitals:   08/06/20 0852  BP: 120/83  Pulse: 88  Weight: 220 lb (99.8 kg)    Fetal Status:     Movement: Present     General:  Alert, oriented and cooperative. Patient is in no acute distress.  Skin: Skin is warm and dry. No rash noted.   Cardiovascular: Normal heart rate noted  Respiratory: Normal respiratory effort, no problems with respiration noted  Abdomen: Soft, gravid, appropriate for gestational age. Pain/Pressure: Present     Pelvic:  Cervical exam deferred        Extremities: Normal range of motion.  Edema: None  Mental Status: Normal mood and affect. Normal behavior. Normal judgment and thought content.   Urinalysis:      Assessment and Plan:  Pregnancy: G1P0 at 332w2d Preterm labor symptoms and general obstetric precautions including but not limited to vaginal bleeding, contractions, leaking of fluid and fetal movement were reviewed in detail with the patient. Please refer to After Visit Summary for other counseling recommendations.   Return in about 2 weeks (around 08/20/2020) for R[place]  HaShelly BombardMD  08/06/20

## 2020-08-20 ENCOUNTER — Encounter: Payer: Self-pay | Admitting: Obstetrics

## 2020-08-20 ENCOUNTER — Other Ambulatory Visit: Payer: Self-pay

## 2020-08-20 ENCOUNTER — Other Ambulatory Visit (HOSPITAL_COMMUNITY)
Admission: RE | Admit: 2020-08-20 | Discharge: 2020-08-20 | Disposition: A | Discharge status: Home / Self Care | Payer: Self-pay | Source: Ambulatory Visit | Attending: Obstetrics | Admitting: Obstetrics

## 2020-08-20 ENCOUNTER — Ambulatory Visit (INDEPENDENT_AMBULATORY_CARE_PROVIDER_SITE_OTHER): Payer: Self-pay | Admitting: Obstetrics

## 2020-08-20 VITALS — BP 106/74 | HR 88 | Wt 220.5 lb

## 2020-08-20 DIAGNOSIS — D259 Leiomyoma of uterus, unspecified: Secondary | ICD-10-CM

## 2020-08-20 DIAGNOSIS — B181 Chronic viral hepatitis B without delta-agent: Secondary | ICD-10-CM

## 2020-08-20 DIAGNOSIS — Z3400 Encounter for supervision of normal first pregnancy, unspecified trimester: Secondary | ICD-10-CM | POA: Insufficient documentation

## 2020-08-20 DIAGNOSIS — O3410 Maternal care for benign tumor of corpus uteri, unspecified trimester: Secondary | ICD-10-CM

## 2020-08-20 NOTE — Progress Notes (Signed)
Pt presents for ROB, GBS, GC/CT.

## 2020-08-20 NOTE — Progress Notes (Signed)
Subjective:  Kerri Black is a 35 y.o. G1P0 at 13w2dbeing seen today for ongoing prenatal care.  She is currently monitored for the following issues for this low-risk pregnancy and has Encounter for supervision of normal pregnancy, antepartum and Hepatitis B affecting pregnancy on their problem list.  Patient reports no complaints.  Contractions: Not present. Vag. Bleeding: None.  Movement: Present. Denies leaking of fluid.   The following portions of the patient's history were reviewed and updated as appropriate: allergies, current medications, past family history, past medical history, past social history, past surgical history and problem list. Problem list updated.  Objective:   Vitals:   08/20/20 0900  BP: 106/74  Pulse: 88  Weight: 220 lb 8 oz (100 kg)    Fetal Status:     Movement: Present     General:  Alert, oriented and cooperative. Patient is in no acute distress.  Skin: Skin is warm and dry. No rash noted.   Cardiovascular: Normal heart rate noted  Respiratory: Normal respiratory effort, no problems with respiration noted  Abdomen: Soft, gravid, appropriate for gestational age. Pain/Pressure: Present     Pelvic:  Cervical exam deferred        Extremities: Normal range of motion.  Edema: None  Mental Status: Normal mood and affect. Normal behavior. Normal judgment and thought content.   Urinalysis:      Assessment and Plan:  Pregnancy: G1P0 at 327w2d1. Supervision of normal first pregnancy, antepartum Rx: - Cervicovaginal ancillary only( [place]) - Strep Gp B NAA  2. Uterine fibroid in pregnancy - clinically stable  3. [place]  Preterm labor symptoms and general obstetric precautions including but not limited to vaginal bleeding, contractions, leaking of fluid and fetal movement were reviewed in detail with the patient. Please refer to After Visit Summary for other counseling recommendations.   Return in about 1 week (around  08/27/2020) for ROB.   HaShelly BombardMD  08/20/20

## 2020-08-22 LAB — STREP GP B NAA: Strep Gp B NAA: NEGATIVE

## 2020-08-23 ENCOUNTER — Inpatient Hospital Stay (HOSPITAL_BASED_OUTPATIENT_CLINIC_OR_DEPARTMENT_OTHER): Payer: Self-pay

## 2020-08-23 ENCOUNTER — Encounter (HOSPITAL_COMMUNITY): Payer: Self-pay | Admitting: Obstetrics and Gynecology

## 2020-08-23 ENCOUNTER — Other Ambulatory Visit: Payer: Self-pay

## 2020-08-23 ENCOUNTER — Inpatient Hospital Stay (HOSPITAL_COMMUNITY)
Admission: AD | Admit: 2020-08-23 | Discharge: 2020-08-23 | Disposition: A | Discharge status: Home / Self Care | Payer: Self-pay | Attending: Obstetrics and Gynecology | Admitting: Obstetrics and Gynecology

## 2020-08-23 DIAGNOSIS — O3413 Maternal care for benign tumor of corpus uteri, third trimester: Secondary | ICD-10-CM | POA: Insufficient documentation

## 2020-08-23 DIAGNOSIS — D259 Leiomyoma of uterus, unspecified: Secondary | ICD-10-CM

## 2020-08-23 DIAGNOSIS — Z3689 Encounter for other specified antenatal screening: Secondary | ICD-10-CM

## 2020-08-23 DIAGNOSIS — Z3A36 36 weeks gestation of pregnancy: Secondary | ICD-10-CM

## 2020-08-23 DIAGNOSIS — O368130 Decreased fetal movements, third trimester, not applicable or unspecified: Secondary | ICD-10-CM

## 2020-08-23 DIAGNOSIS — Z369 Encounter for antenatal screening, unspecified: Secondary | ICD-10-CM

## 2020-08-23 LAB — CERVICOVAGINAL ANCILLARY ONLY
Bacterial Vaginitis (gardnerella): NEGATIVE
Candida Glabrata: NEGATIVE
Candida Vaginitis: NEGATIVE
Chlamydia: NEGATIVE
Comment: NEGATIVE
Comment: NEGATIVE
Comment: NEGATIVE
Comment: NEGATIVE
Comment: NEGATIVE
Comment: NORMAL
Neisseria Gonorrhea: NEGATIVE
Trichomonas: NEGATIVE

## 2020-08-23 IMAGING — US US MFM FETAL BPP W/O NON-STRESS
1 series · 12 of 27 positions shown · non-contrast
Comparison: none

[Series 1: us mfm fetal bpp w/o non-stress · 27 acquisitions, 12 frames shown]
[im 2/27]
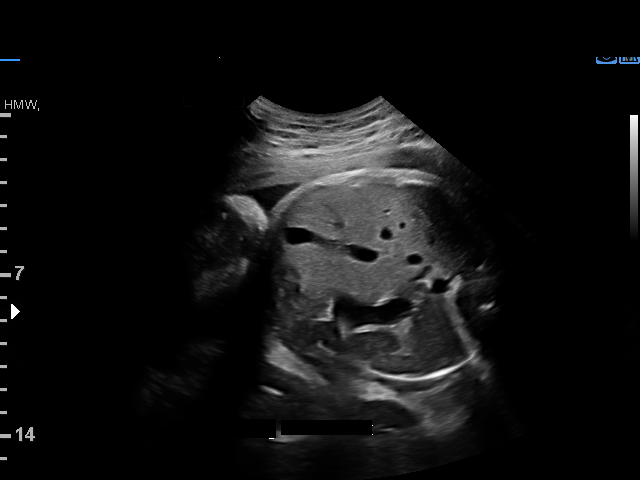
[im 4/27]
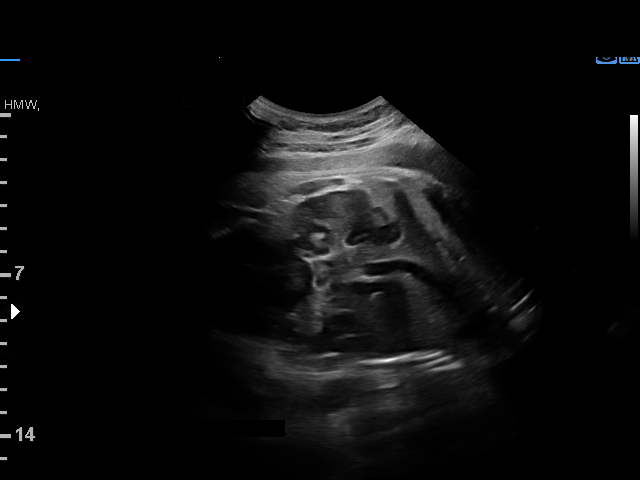
[im 6/27]
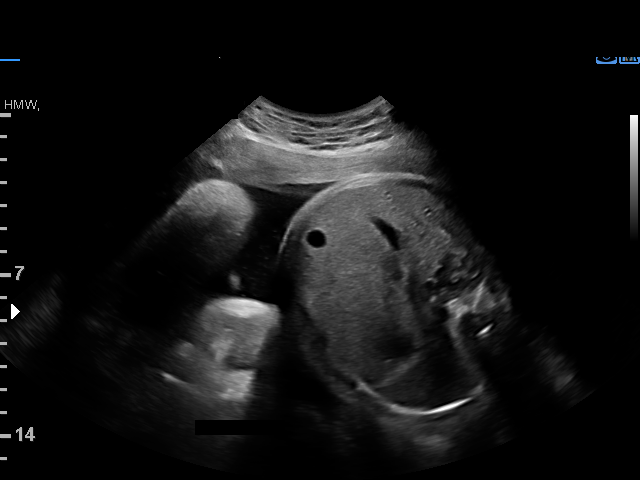
[im 8/27]
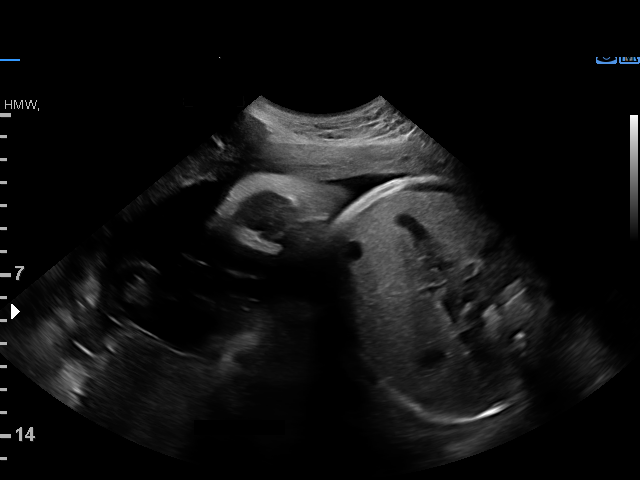
[im 11/27]
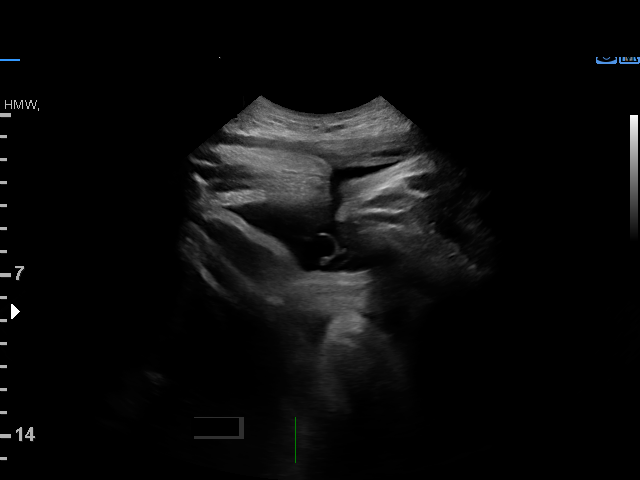
[im 13/27]
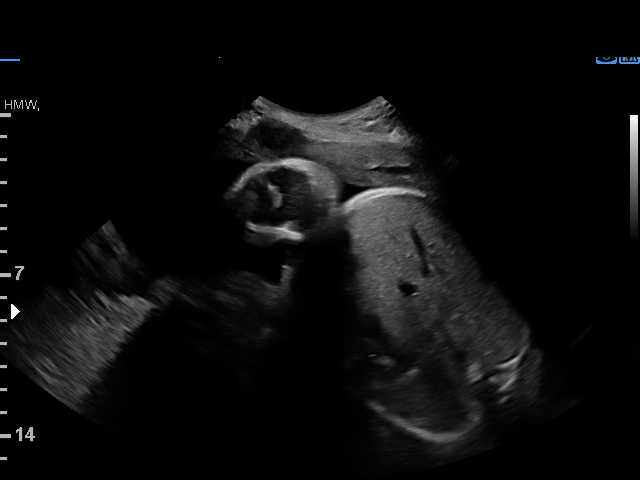
[im 15/27]
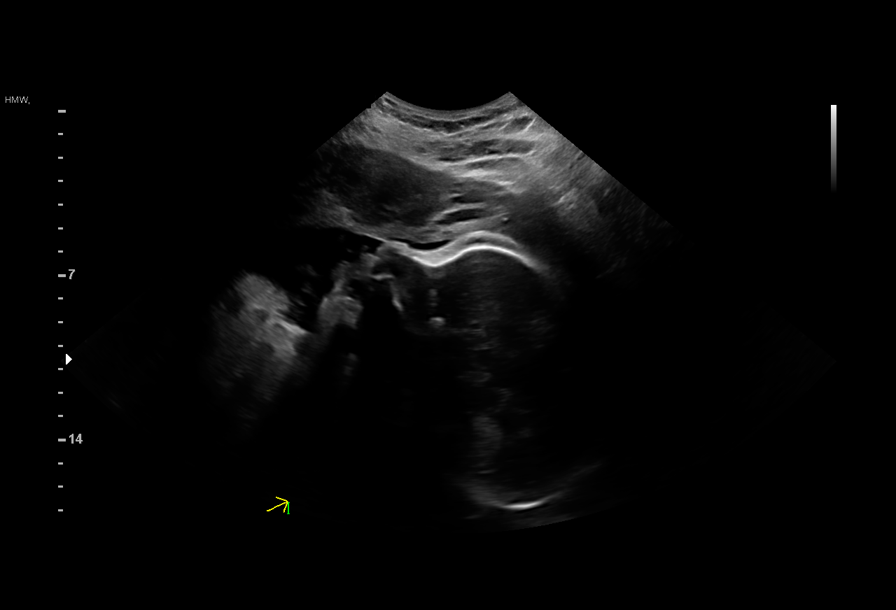
[im 17/27]
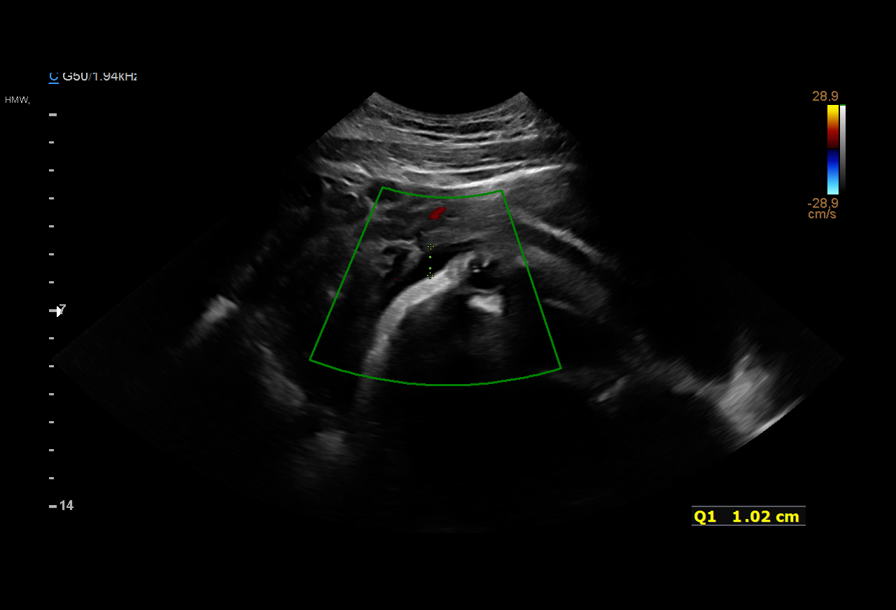
[im 20/27]
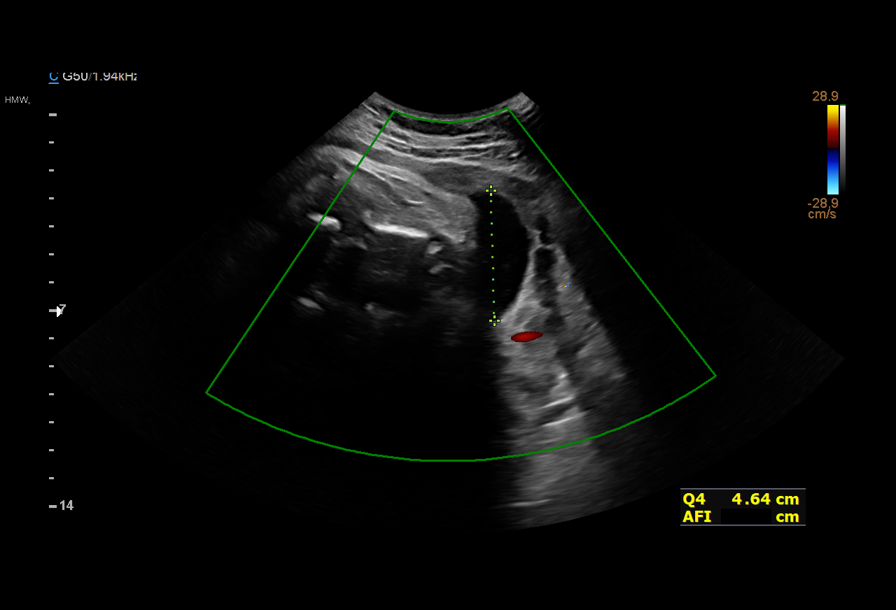
[im 22/27]
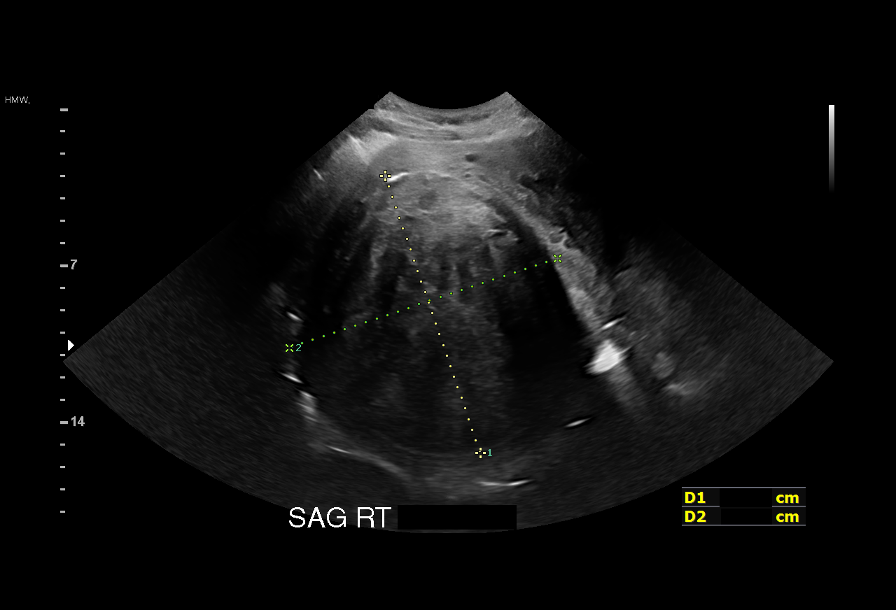
[im 24/27]
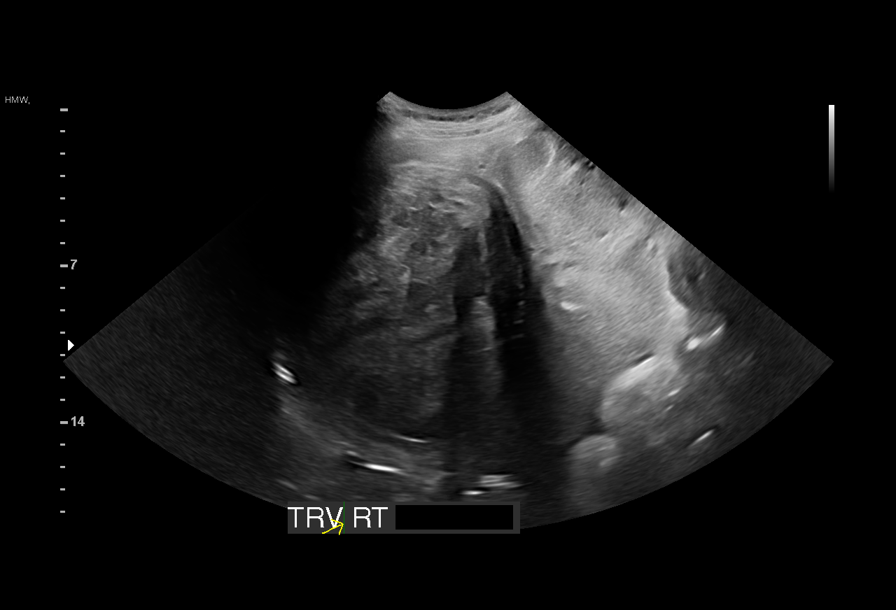
[im 26/27]
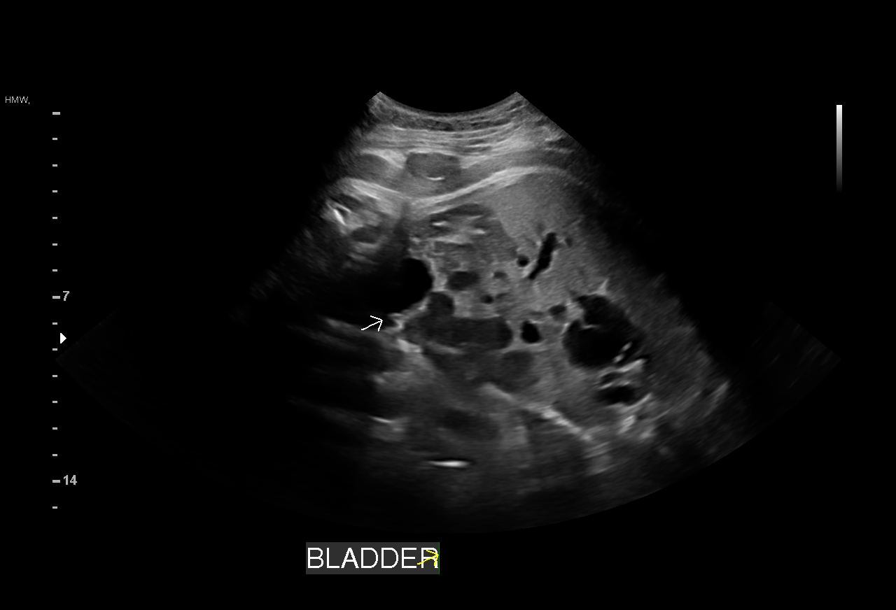

[12 of 27 positions shown; findings below may reference images not displayed]

Indications

 Decreased fetal movements, third trimester,    [AN]
 unspecified
 36 weeks gestation of pregnancy
 Encounter for antenatal screening,             [AN]
 unspecified
 Uterine fibroids affecting pregnancy in third  O34.13, [AN]
 trimester, antepartum
Fetal Evaluation

 Num Of Fetuses:         1
 Fetal Heart Rate(bpm):  140
 Cardiac Activity:       Observed
 Presentation:           Cephalic

 Amniotic Fluid
 AFI FV:      Within normal limits

 AFI Sum(cm)     %Tile       Largest Pocket(cm)
 14.5            54          6

 RUQ(cm)       RLQ(cm)       LUQ(cm)        LLQ(cm)
 1             4.6           6
Biophysical Evaluation

 Amniotic F.V:   Within normal limits       F. Tone:        Observed
 F. Movement:    Observed                   Score:          [DATE]
 F. Breathing:   Observed
OB History

 Gravidity:    1         Term:   0        Prem:   0        SAB:   0
 TOP:          0       Ectopic:  0        Living: 0
Gestational Age

 LMP:           36w 5d        Date:  [DATE]                 EDD:   [DATE]
 Best:          36w 5d     Det. By:  LMP  ([DATE])          EDD:   [DATE]
Anatomy

 Thoracic:              Appears normal         Bladder:                Appears normal
 Stomach:               Appears normal, left
                        sided
Myomas

 Site                     L(cm)      W(cm)      D(cm)       Location
 Right

 Blood Flow                  RI       PI       Comments

Impression

 Antenatal testing performed given maternal decreased fetal
 movement.
 The biophysical profile was [DATE] with good fetal movement and
 amniotic fluid volume.
Recommendations

 Continue weekly testing.

## 2020-08-23 NOTE — MAU Provider Note (Signed)
History   [id number]   Chief Complaint  Patient presents with   Decreased Fetal Movement    HPI Kerri Black is a 35 y.o. female  G1P0000 here with report of decreased fetal movement since today. Reports feeling the baby move 5 times in the last 2 hours. Baby is not moving as strong as she is used to. Denies abdominal pain, vaginal bleeding, LOF, or abdominal trauma.   Patient's last menstrual period was 12/10/2019.  OB History  Gravida Para Term Preterm AB Living  1 0 0 0 0 0  SAB IAB Ectopic Multiple Live Births  0 0 0 0 0    # Outcome Date GA Lbr Len/2nd Weight Sex Delivery Anes PTL Lv  1 Current             Past Medical History:  Diagnosis Date   Hepatitis    hep b per pt    History reviewed. No pertinent family history.  Social History   Socioeconomic History   Marital status: Married    Spouse name: Not on file   Number of children: Not on file   Years of education: Not on file   Highest education level: Not on file  Occupational History   Not on file  Tobacco Use   Smoking status: Never   Smokeless tobacco: Never  Vaping Use   Vaping Use: Never used  Substance and Sexual Activity   Alcohol use: Never   Drug use: Never   Sexual activity: Yes    Birth control/protection: None  Other Topics Concern   Not on file  Social History Narrative   Not on file   Social Determinants of Health   Financial Resource Strain: Not on file  Food Insecurity: Not on file  Transportation Needs: Not on file  Physical Activity: Not on file  Stress: Not on file  Social Connections: Not on file    No Known Allergies  No current facility-administered medications on file prior to encounter.   Current Outpatient Medications on File Prior to Encounter  Medication Sig Dispense Refill   Prenatal Vit-Fe Fumarate-FA (PRENATAL VITAMIN PO) Take by mouth.       Review of Systems  Constitutional: Negative.   Gastrointestinal: Negative.   Genitourinary: Negative.      Physical Exam   Vitals:   08/23/20 2233 08/23/20 2342  BP: 135/70 123/75  Pulse: 73 81  Resp: 18   Temp: 98.2 F (36.8 C)   TempSrc: Oral   SpO2: 100%     Physical Exam Vitals and nursing note reviewed.  Constitutional:      General: She is not in acute distress.    Appearance: Normal appearance.  HENT:     Head: Normocephalic and atraumatic.  Eyes:     General: No scleral icterus. Pulmonary:     Effort: Pulmonary effort is normal. No respiratory distress.  Abdominal:     General: There is no distension.     Palpations: Abdomen is soft.  Skin:    General: Skin is warm and dry.  Neurological:     Mental Status: She is alert.  Psychiatric:        Mood and Affect: Mood normal.        Behavior: Behavior normal.   NST:  Baseline: 145 bpm, Variability: Good {> 6 bpm), Accelerations: Reactive, and Decelerations: Absent  MAU Course  Procedures  MDM Patient reports change in fetal movement.  Category 1 tracing in MAU.  BPP 8/8 with normal AFI.  Patient and spouse are reassured.  Assessment and Plan   1. Decreased fetal movement, third trimester, not applicable or unspecified fetus   2. [redacted] weeks gestation of pregnancy   3. NST (non-stress test) reactive    -reviewed reasons to return to MAU   Jorje Guild, NP 08/24/2020 3:31 AM

## 2020-08-23 NOTE — MAU Note (Signed)
..  Kerri Black is a 35 y.o. at 46w5dhere in MAU reporting: Decreased fetal movement, feels movement but not as strong. Denies contractions, vaginal bleeding or leaking of fluid.   Pain score: 0/10 Vitals:   08/23/20 2233  BP: 135/70  Pulse: 73  Resp: 18  Temp: 98.2 F (36.8 C)  SpO2: 100%     FHT:147

## 2020-08-27 ENCOUNTER — Encounter: Payer: Self-pay | Admitting: Obstetrics and Gynecology

## 2020-08-27 ENCOUNTER — Ambulatory Visit (INDEPENDENT_AMBULATORY_CARE_PROVIDER_SITE_OTHER): Payer: Self-pay | Admitting: Obstetrics and Gynecology

## 2020-08-27 ENCOUNTER — Other Ambulatory Visit: Payer: Self-pay

## 2020-08-27 VITALS — BP 110/76 | HR 87 | Wt 224.0 lb

## 2020-08-27 DIAGNOSIS — Z3400 Encounter for supervision of normal first pregnancy, unspecified trimester: Secondary | ICD-10-CM

## 2020-08-27 DIAGNOSIS — O98419 Viral hepatitis complicating pregnancy, unspecified trimester: Secondary | ICD-10-CM

## 2020-08-27 DIAGNOSIS — B1910 Unspecified viral hepatitis B without hepatic coma: Secondary | ICD-10-CM

## 2020-08-27 NOTE — Patient Instructions (Signed)
AREA PEDIATRIC/FAMILY PRACTICE PHYSICIANS  [place] ([address]) [facility] Chambliss, MD; Eniola, MD; Hale, MD; Hensel, MD; McDiarmid, MD; McIntyer, MD; Neal, MD; Walden, MD [address] [place], [place] [address] [phone] Mon-Fri 8:30-12:30, 1:30-5:00 Providers come to see babies at [facility] Accepting Medicaid [facility] providers who accept newborns: Koirala, MD; Morrow, MD; Wolters, MD [address], [place], [place] [address] [phone] Mon-Fri 8:00-5:30 Babies seen by providers at [facility] Does NOT accept Medicaid Please call early in hospitalization for appointment (limited availability)  [facility] Mulberry, MD [address] [place], [place] [address] [phone] Mon, Tue, Thur, Fri 8:30-5:00, Wed 10:00-7:00 (closed 1-2pm) Babies seen by [facility] providers Accepting Medicaid Rubin - Pediatrician Rubin, MD [address], [place], [place] [address] [phone] Mon-Fri 8:30-5:00, Sat 8:30-12:00 Provider comes to see babies at [facility] Accepting Medicaid Must have been referred from current patients or contacted office prior to delivery [facility] ([facility]) Brown, MD; Chandler, MD; Ettefagh, MD; Grant, MD; Lester, MD; McCormick, MD; McQueen, MD; Prose, MD; Simha, MD; Stanley, MD; Stryffeler, NP; Tebben, NP [address], [place], [place] [address] [phone] Mon, Tue, Thur, Fri 8:30-5:30, Wed 9:30-5:30, Sat 8:30-12:30 Babies seen by [facility] providers Accepting Medicaid Only accepting infants of first-time parents or siblings of current patients Hospital discharge coordinator will make follow-up appointment Jack Amos [address], [place], [place]  [address] [phone]   Fax - [phone] [facility] [address], [place], [place]  [address] Phone - [phone]   Fax - [phone] Shilpa Gosrani [address], [place], [place]  [address] [phone]  [place]heast [place] ([address]) [facility] Bates, MD; Brassfield, MD; Cooper, Cox, MD; MD; Davis, MD; Dovico, MD; Ettefaugh, MD; Little, MD; Lowe, MD; Keiffer, MD; Melvin, MD; Sumner, MD; Williams, MD [address], [place], [place] [address] [phone] Mon-Fri 8:30-5:00 (extended evenings Mon-Thur as needed), Sat-Sun 10:00-1:00 Providers come to see babies at [facility] Accepting Medicaid for families of first-time babies and families with all children in the household age 3 and under. Must register with office prior to making appointment (M-F only). [facility] Henson, NP; Knapp, MD; Lalonde, MD; Tysinger, PA [address] [place], [place] [address] [phone] Mon-Fri 8:00-5:00 Babies seen by providers at [facility] Does NOT accept Medicaid/Commercial Insurance Only [facility] ([facility])  Artis, MD; Barnes, MD; Bratton, MD; Coccaro, MD; Lockett Gardner, MD; Kramer, MD; Marshall, MD; Netherton, MD; Poleto, MD; Skinner, MD [address] [place], [place] [address] [phone] Mon-Fri 8:30-5:30, Sat (Oct.-Mar.) 9:00-1:00 Babies seen by providers at [facility] Accepting Medicaid  [place] ([address]) [facility] Reid, MD; Warner, MD [address], [place], [place] [address] [phone] Mon-Fri 8:30-5:00, Sat 8:30-12:00 Providers come to see babies at [facility] Does NOT accept Medicaid [facility] Becker, PA; Hagler, MD; Scifres, PA; Sun, MD; Swayne, MD [address], [place],  [address] [phone] Mon-Fri 8:00-5:00 Babies seen by providers at [facility] Does NOT accept Medicaid Only accepting babies of parents who  are patients Please call early in hospitalization for appointment (limited availability) [facility] Clark, MD; Frye, MD; Kelleher, MD; Mack, NP; Miller, MD; O'Keller, MD; Patterson, NP; Pudlo, MD; Puzio, MD; Thomas, MD; Tucker, MD; Twiselton, MD [address], [place], [place] [address] [phone] Mon-Fri 8:00-5:00, Sat 9:00-12:00 Providers come to see babies at [facility] Does NOT accept Medicaid  [place] ([address]) [facility] providers accepting new patients: Brake, NP; Wharton, PA [address], [place], [place] [address] [phone] Mon-Fri 8:00-5:00 Babies seen by providers at [facility] Does NOT accept Medicaid Only accepting babies of parents who are patients Please call early in hospitalization for appointment (limited availability) [facility] Gay, MD; Quinlan, MD [address] [place], [place] [address] [phone] (press 1 to schedule appointment) Mon-Fri 8:00-5:00 Providers come to see babies at [facility] Does NOT accept Medicaid [facility] Mazer, MD [address] [place], [place] [address] [phone] Mon-Fri 8:30-5:00 (lunch 12:30-1:00), extended hours by appointment only Wed 5:00-6:30 Babies seen by [facility] providers Accepting Medicaid [facility] Banks, MD; Jordan, MD; Koberlein, MD [address], [place], [place] [address] [phone] Mon-Fri 8:00-5:00 Babies seen by [facility] providers Does NOT accept Medicaid [facility] Parker, MD; Hunter, MD; Wallace, DO [address] [place], [place] [address] [phone] Mon-Fri 8:00-5:00 Babies seen by [facility] providers Does NOT accept Medicaid [facility] Brandon, PA; Brecken, PA; Christy, NP; Dees, MD; DeClaire, MD; DeWeese, MD; Hansen, NP; Mills, NP; Parrish, NP; Smoot, NP; Summer, MD; Vapne,  MD [address] [place], [place] [address] [phone] Mon-Fri 8:30-5:00, Sat 10:00-1:00 Providers come to see babies at [facility] Does NOT accept Medicaid Free prenatal information session Tuesdays at 4:45pm [facility] Bouska, MD; Gordon, PA; Jeffery, PA; Weber, PA [address] [place] [address] [phone] Mon-Fri 7:30-5:30 Babies seen by [facility] providers [facility] [address], [place], [place]  [address] [phone]   Fax - [phone]  [place] ([address] & [address]) [facility] [place] [address] [place], [place] [address] [phone] Mon-Thur 8:00-6:00 Providers come to see babies at [facility] Accepting Medicaid [facility] Anderson, NP; Badger, MD; Beal, PA; Spencer, PA [address] [place], [place] [address] [phone] Mon-Thur 7:30-7:30, Fri 7:30-4:30 Babies seen by [facility] providers Accepting Medicaid [facility] Agbuya, MD; Klett, NP; Romgoolam, MD [address], [place], [place] [address] [phone] Mon-Fri 8:30-5:00, Sat 8:30-12:00 Providers come to see babies at [facility] Accepting Medicaid Must have "Meet & Greet" appointment at office prior to delivery [facility] McCord, MD; Wallace, MD; Wood, MD [address], [place], [place] [address] [phone] Mon-Wed 8:00-6:00, Thur-Fri 8:00-5:00, Sat 9:00-12:00 Providers come to see babies at [facility] Does NOT accept Medicaid Only accepting siblings of current patients [facility]  [address], [place], [place]  [address] [phone]   Fax - [phone] [facility] [address], [place], [place]  [address] [phone]   Fax -  [phone]  [place] ([address] & [address]) [facility] Cirigliano, DO; Matthews, DO [address] [place], [place] [address] [phone] Mon-Fri 7:00-5:00 Babies seen by [facility] providers Does NOT accept Medicaid [facility] Briscoe, MD; Howley, PA; Moreira, PA [address], [place], [place] [address] [phone] Mon-Fri 8:00-5:00 Babies seen by [facility] providers Accepting Medicaid [facility] Boyd, MD; Church, PA; Jones, NP; Osborn, PA [address], , [place] [address] [phone] Mon-Fri 8:00-5:00 Babies seen by providers at [facility] Accepting Medicaid  [place] ([address]) [facility] [place] Wendling, DO [address] [place], [place] [address] [phone] Mon-Fri 8:00-5:00 Babies seen by [facility] providers Does NOT accept Medicaid Limited availability, please call early in hospitalization to schedule follow-up [facility] Calderon, PA; Cummings, MD; Dillard, MD; Martin, PA; Olson, MD; VanDeven, PA [address], [place], [place] [address] [phone] Mon-Fri 8:30-5:00, Sat 9:00-12:00 Babies seen by providers at [facility] Accepting Medicaid Please register online then schedule online or call office [email or web address] Hunter, NP; Kumar, MD; Martin Rogers, PA [address], [place], [place] [address] [phone] Mon-Fri 8:00-5:00 Babies seen by providers at [facility] Accepting Medicaid [facility] ([facility]) Aulander, MD; Kristi Fleenor, NP; West, MD [address], [place], [place] [address] [phone] Mon-Fri 8:00-5:30, Sat&Sun by appointment (phones open at  8:30) Babies seen by [facility] providers Accepting Medicaid Must be a first-time baby or sibling of current patient [facility] [place]  [address], [place], [place]  [address] [phone]   Fax - [phone]  [place] ([address]) [facility] Brown, PA; Cowen, PA; Rice, MD; Helton, PA; Spry, MD [address] [place], [place] [address] [phone] Mon-Thur 8:00-7:00, Fri 8:00-5:00, Sat 8:00-12:00, Sun 9:00-12:00 Babies seen by [facility] providers Accepting Medicaid [facility] Coe-Goins, MD; Marshall, MD; Pierre-Louis, MD [address], [place], [place] [address] [phone] Mon-Thur 8:00-5:00 Babies seen by providers at [facility] Accepting Medicaid [facility] Bratton, MD; Coe-Goins, MD; Hayes, MD; Lewis, MD; List, MD; Lott, MD; Marshall, MD; Moran, MD; O'Neal, MD; Pierre-Louis, MD; Pitonzo, MD; Scholer, MD; Spangle, MD [address] [place], [place] [address] [phone] Mon-Fri 8:00-5:30, Sat (Oct.-Mar.) 9:00-1:00 Babies seen by providers at [facility] Accepting Medicaid Must fill out new patient packet, available online at [email or web address] [facility] ([facility]) Friddle, NP; Harris, NP; Kelly, NP; Logan, MD; Melvin, PA; Poth, MD; Ramadoss, MD; Stanton, NP [address], [place], [place] [address] [phone] Mon-Thur 8:00-5:30, Fri 8:00-5:00 Babies seen by providers at [facility] Accepting Medicaid  [place] ([address]) [facility] Dixon, PA; Paramus, MD; Pickard, MD; Tapia, PA [address], [place], [place] [address] [phone] Mon-Fri 8:00-5:00 Babies seen by providers at [facility] Accepting Medicaid   [place] ([address]) [facility] [place] Masneri, DO; Meyers, MD; Nelson, PA [address], [place], [place] [address] [phone] Mon-Fri 8:00-5:00 Babies seen by providers at [facility] Does NOT accept Medicaid Limited appointment availability, please call early in hospitalization  [facility] [place] Kunedd, DO; McGowen, MD [address], [place], [place] [address] [phone] Mon-Fri 8:00-5:00 Babies seen by [facility] providers Does NOT accept Medicaid [facility] [place] Cameron, MD; MacDonald, MD; Michaels, PA; Nayak, MD [address] [place] [address], [place], [place] [address] [phone] Mon-Fri 8:00-5:00 After hours clinic ([address] [place], [place] [address]) [phone] Mon-Fri 5:00-8:00, Sat 12:00-6:00, Sun 10:00-4:00 Babies seen by [facility] providers Accepting Medicaid [facility] [place] [address], [place], [place]  [address] [phone]   Fax - [phone]  [place] ([address]) [facility] [place] [facility] Andy, MD [address], [place], [place] [address] [phone] Mon-Fri 8:00-5:00 Babies seen by [facility] providers Does NOT accept Medicaid [facility] [place] ([facility] [place]) Eksir, MD [address], [place], [place] [address] [phone] Mon-Thur 8:00-7:00, Fri 8:00-5:00, Sat 8:00-12:00 Babies seen by providers at [facility] Accepting Medicaid - but does not have vaccinations in office (must be received elsewhere) Limited availability, please call early in hospitalization  [place] ([address]) [facility]  Charlene Flemming, MD [address], [place] [address] [phone]  Fax [phone]  [facility] [place] [facility]  Kimberly [place], MD, Annamarie Streilein, PA, Carla Hampton, PA [address] [place] [place], [place]  [address] [phone] [facility]  [address], [place], [place] [address] [phone] [address], [place], [place] [address] [phone] (West Office)  [facility] [address], [place], [place] [address] [phone] [facility] [address] [place], [place], [place] [address] [phone] [facility] [address], [place], [place] [address] [phone] [facility] [address], [place], [place] [address] [phone] [facility] [address], [place], [place] [address] [phone] [facility] [address], , [place] [address] [phone] [facility]  [address], [place], [place] [address] [phone] Dr[facility] [address] [place] [address], , [place] [address] [phone] [facility] [address], [place], [place] [address] [phone] [facility] [address], , [place] [address] [phone]  

## 2020-08-27 NOTE — Progress Notes (Signed)
   PRENATAL VISIT NOTE  Subjective:  Kerri Black is a 35 y.o. G1P0000 at 68w2dbeing seen today for ongoing prenatal care.  She is currently monitored for the following issues for this low-risk pregnancy and has Encounter for supervision of normal pregnancy, antepartum and Hepatitis B affecting pregnancy on their problem list.  Patient reports no complaints.  Contractions: Not present. Vag. Bleeding: None.  Movement: Present. Denies leaking of fluid.   The following portions of the patient's history were reviewed and updated as appropriate: allergies, current medications, past family history, past medical history, past social history, past surgical history and problem list.   Objective:   Vitals:   08/27/20 0839  BP: 110/76  Pulse: 87  Weight: 224 lb (101.6 kg)    Fetal Status: Fetal Heart Rate (bpm): 136 Fundal Height: 37 cm Movement: Present     General:  Alert, oriented and cooperative. Patient is in no acute distress.  Skin: Skin is warm and dry. No rash noted.   Cardiovascular: Normal heart rate noted  Respiratory: Normal respiratory effort, no problems with respiration noted  Abdomen: Soft, gravid, appropriate for gestational age.  Pain/Pressure: Absent     Pelvic: Cervical exam deferred        Extremities: Normal range of motion.  Edema: None  Mental Status: Normal mood and affect. Normal behavior. Normal judgment and thought content.   Assessment and Plan:  Pregnancy: G1P0000 at 336w2d. Supervision of normal first pregnancy, antepartum Patient is doing well without complaints Patient plans natural family planning for contraception Undecided on pediatrician- list provided  2. Hepatitis B affecting pregnancy Stable  Term labor symptoms and general obstetric precautions including but not limited to vaginal bleeding, contractions, leaking of fluid and fetal movement were reviewed in detail with the patient. Please refer to After Visit Summary for other counseling  recommendations.   Return in about 1 week (around 09/03/2020) for in person, ROB, Low risk.  No future appointments.  PeMora BellmanMD

## 2020-08-27 NOTE — Progress Notes (Signed)
+   Fetal movement. No complaints.

## 2020-09-03 ENCOUNTER — Ambulatory Visit (INDEPENDENT_AMBULATORY_CARE_PROVIDER_SITE_OTHER): Payer: Self-pay | Admitting: Obstetrics

## 2020-09-03 ENCOUNTER — Encounter: Payer: Self-pay | Admitting: Obstetrics

## 2020-09-03 ENCOUNTER — Other Ambulatory Visit: Payer: Self-pay

## 2020-09-03 VITALS — BP 114/72 | HR 91 | Wt 226.0 lb

## 2020-09-03 DIAGNOSIS — Z3400 Encounter for supervision of normal first pregnancy, unspecified trimester: Secondary | ICD-10-CM

## 2020-09-03 DIAGNOSIS — D259 Leiomyoma of uterus, unspecified: Secondary | ICD-10-CM

## 2020-09-03 DIAGNOSIS — O3410 Maternal care for benign tumor of corpus uteri, unspecified trimester: Secondary | ICD-10-CM

## 2020-09-03 DIAGNOSIS — B181 Chronic viral hepatitis B without delta-agent: Secondary | ICD-10-CM

## 2020-09-03 NOTE — Progress Notes (Signed)
Subjective:  Kerri Black is a 35 y.o. G1P0000 at 75w2dbeing seen today for ongoing prenatal care.  She is currently monitored for the following issues for this low-risk pregnancy and has Encounter for supervision of normal pregnancy, antepartum and Hepatitis B affecting pregnancy on their problem list.  Patient reports no complaints.  Contractions: Not present. Vag. Bleeding: None.  Movement: Present. Denies leaking of fluid.   The following portions of the patient's history were reviewed and updated as appropriate: allergies, current medications, past family history, past medical history, past social history, past surgical history and problem list. Problem list updated.  Objective:   Vitals:   09/03/20 0841  BP: 114/72  Pulse: 91  Weight: 226 lb (102.5 kg)    Fetal Status:     Movement: Present     General:  Alert, oriented and cooperative. Patient is in no acute distress.  Skin: Skin is warm and dry. No rash noted.   Cardiovascular: Normal heart rate noted  Respiratory: Normal respiratory effort, no problems with respiration noted  Abdomen: Soft, gravid, appropriate for gestational age. Pain/Pressure: Absent     Pelvic:  Cervical exam deferred        Extremities: Normal range of motion.     Mental Status: Normal mood and affect. Normal behavior. Normal judgment and thought content.   Urinalysis:      Assessment and Plan:  Pregnancy: G1P0000 at 359w2d1. Supervision of normal first pregnancy, antepartum  2. Uterine fibroid in pregnancy - clinically stable  3. Hepatitis B carrier ([place]- clinically stable   Term labor symptoms and general obstetric precautions including but not limited to vaginal bleeding, contractions, leaking of fluid and fetal movement were reviewed in detail with the patient. Please refer to After Visit Summary for other counseling recommendations.   Return in about 1 week (around 09/10/2020) for ROB.   HaShelly BombardMD  09/03/20

## 2020-09-10 ENCOUNTER — Encounter (HOSPITAL_COMMUNITY): Payer: Self-pay | Admitting: Obstetrics and Gynecology

## 2020-09-10 ENCOUNTER — Other Ambulatory Visit: Payer: Self-pay

## 2020-09-10 ENCOUNTER — Inpatient Hospital Stay (HOSPITAL_COMMUNITY)
Admission: AD | Admit: 2020-09-10 | Discharge: 2020-09-13 | DRG: 786 | Disposition: A | Discharge status: Home / Self Care | Payer: Medicaid Other | Attending: Family Medicine | Admitting: Family Medicine

## 2020-09-10 ENCOUNTER — Encounter: Payer: Self-pay | Admitting: Medical

## 2020-09-10 DIAGNOSIS — O3413 Maternal care for benign tumor of corpus uteri, third trimester: Secondary | ICD-10-CM | POA: Diagnosis present

## 2020-09-10 DIAGNOSIS — B1910 Unspecified viral hepatitis B without hepatic coma: Secondary | ICD-10-CM | POA: Diagnosis present

## 2020-09-10 DIAGNOSIS — Z20822 Contact with and (suspected) exposure to covid-19: Secondary | ICD-10-CM | POA: Diagnosis present

## 2020-09-10 DIAGNOSIS — O4292 Full-term premature rupture of membranes, unspecified as to length of time between rupture and onset of labor: Secondary | ICD-10-CM | POA: Diagnosis present

## 2020-09-10 DIAGNOSIS — D259 Leiomyoma of uterus, unspecified: Secondary | ICD-10-CM | POA: Diagnosis present

## 2020-09-10 DIAGNOSIS — Z3A39 39 weeks gestation of pregnancy: Secondary | ICD-10-CM

## 2020-09-10 DIAGNOSIS — O9081 Anemia of the puerperium: Secondary | ICD-10-CM | POA: Diagnosis not present

## 2020-09-10 DIAGNOSIS — O9842 Viral hepatitis complicating childbirth: Secondary | ICD-10-CM | POA: Diagnosis present

## 2020-09-10 DIAGNOSIS — D62 Acute posthemorrhagic anemia: Secondary | ICD-10-CM | POA: Diagnosis not present

## 2020-09-10 DIAGNOSIS — Z98891 History of uterine scar from previous surgery: Secondary | ICD-10-CM

## 2020-09-10 DIAGNOSIS — O411230 Chorioamnionitis, third trimester, not applicable or unspecified: Secondary | ICD-10-CM | POA: Diagnosis present

## 2020-09-10 DIAGNOSIS — O4202 Full-term premature rupture of membranes, onset of labor within 24 hours of rupture: Secondary | ICD-10-CM | POA: Diagnosis not present

## 2020-09-10 DIAGNOSIS — R58 Hemorrhage, not elsewhere classified: Secondary | ICD-10-CM

## 2020-09-10 HISTORY — DX: Leiomyoma of uterus, unspecified: D25.9

## 2020-09-10 LAB — CBC
HCT: 34.1 % — ABNORMAL LOW (ref 36.0–46.0)
Hemoglobin: 10.8 g/dL — ABNORMAL LOW (ref 12.0–15.0)
MCH: 27 pg (ref 26.0–34.0)
MCHC: 31.7 g/dL (ref 30.0–36.0)
MCV: 85.3 fL (ref 80.0–100.0)
Platelets: 327 10*3/uL (ref 150–400)
RBC: 4 MIL/uL (ref 3.87–5.11)
RDW: 15.3 % (ref 11.5–15.5)
WBC: 7.6 10*3/uL (ref 4.0–10.5)
nRBC: 0 % (ref 0.0–0.2)

## 2020-09-10 LAB — COMPREHENSIVE METABOLIC PANEL
ALT: 17 U/L (ref 0–44)
AST: 19 U/L (ref 15–41)
Albumin: 2.7 g/dL — ABNORMAL LOW (ref 3.5–5.0)
Alkaline Phosphatase: 167 U/L — ABNORMAL HIGH (ref 38–126)
Anion gap: 7 (ref 5–15)
BUN: 6 mg/dL (ref 6–20)
CO2: 19 mmol/L — ABNORMAL LOW (ref 22–32)
Calcium: 9 mg/dL (ref 8.9–10.3)
Chloride: 108 mmol/L (ref 98–111)
Creatinine, Ser: 0.73 mg/dL (ref 0.44–1.00)
GFR, Estimated: >60 mL/min (ref >60)
Glucose, Bld: 89 mg/dL (ref 70–99)
Potassium: 4.2 mmol/L (ref 3.5–5.1)
Sodium: 134 mmol/L — ABNORMAL LOW (ref 135–145)
Total Bilirubin: 0.4 mg/dL (ref 0.3–1.2)
Total Protein: 5.6 g/dL — ABNORMAL LOW (ref 6.5–8.1)

## 2020-09-10 LAB — TYPE AND SCREEN
ABO/RH(D): B POS
Antibody Screen: NEGATIVE

## 2020-09-10 LAB — POCT FERN TEST: POCT Fern Test: POSITIVE

## 2020-09-10 LAB — RESP PANEL BY RT-PCR (FLU A&B, COVID) ARPGX2
Influenza A by PCR: NEGATIVE
Influenza B by PCR: NEGATIVE
SARS Coronavirus 2 by RT PCR: NEGATIVE

## 2020-09-10 MED ORDER — LIDOCAINE HCL (PF) 1 % IJ SOLN: 30.0000 mL | INTRAMUSCULAR | Status: DC | PRN

## 2020-09-10 MED ORDER — TERBUTALINE SULFATE 1 MG/ML IJ SOLN
0.2500 mg | Freq: Once | INTRAMUSCULAR | Status: AC | PRN
  Administered 2020-09-11: 0.25 mg via SUBCUTANEOUS
  Filled 2020-09-10: qty 1

## 2020-09-10 MED ORDER — ONDANSETRON HCL 4 MG/2ML IJ SOLN
4.0000 mg | Freq: Four times a day (QID) | INTRAMUSCULAR | Status: DC | PRN
  Administered 2020-09-11: 4 mg via INTRAVENOUS
  Filled 2020-09-10: qty 2

## 2020-09-10 MED ORDER — LACTATED RINGERS IV SOLN
500.0000 mL | INTRAVENOUS | Status: DC | PRN
  Administered 2020-09-11: 500 mL via INTRAVENOUS

## 2020-09-10 MED ORDER — LACTATED RINGERS IV SOLN: INTRAVENOUS | Status: DC

## 2020-09-10 MED ORDER — OXYCODONE-ACETAMINOPHEN 5-325 MG PO TABS: 2.0000 | ORAL_TABLET | ORAL | Status: DC | PRN

## 2020-09-10 MED ORDER — MISOPROSTOL 50MCG HALF TABLET
50.0000 ug | ORAL_TABLET | ORAL | Status: DC | PRN
  Administered 2020-09-10: 50 ug via BUCCAL
  Filled 2020-09-10: qty 1

## 2020-09-10 MED ORDER — SOD CITRATE-CITRIC ACID 500-334 MG/5ML PO SOLN
30.0000 mL | ORAL | Status: DC | PRN
  Administered 2020-09-11: 30 mL via ORAL
  Filled 2020-09-10: qty 30

## 2020-09-10 MED ORDER — OXYTOCIN BOLUS FROM INFUSION: 333.0000 mL | Freq: Once | INTRAVENOUS | Status: DC

## 2020-09-10 MED ORDER — FAMOTIDINE 20 MG PO TABS: 20.0000 mg | ORAL_TABLET | Freq: Every day | ORAL | Status: DC

## 2020-09-10 MED ORDER — ACETAMINOPHEN 325 MG PO TABS: 650.0000 mg | ORAL_TABLET | ORAL | Status: DC | PRN

## 2020-09-10 MED ORDER — OXYTOCIN-SODIUM CHLORIDE 30-0.9 UT/500ML-% IV SOLN
2.5000 [IU]/h | INTRAVENOUS | Status: DC
  Filled 2020-09-10: qty 500

## 2020-09-10 MED ORDER — MISOPROSTOL 50MCG HALF TABLET
ORAL_TABLET | ORAL | Status: AC
  Administered 2020-09-10: 50 ug via BUCCAL
  Filled 2020-09-10: qty 1

## 2020-09-10 MED ORDER — OXYCODONE-ACETAMINOPHEN 5-325 MG PO TABS
1.0000 | ORAL_TABLET | ORAL | Status: DC | PRN
Start: 2020-09-10 — End: 2020-09-11

## 2020-09-10 NOTE — Progress Notes (Signed)
Labor Progress Note Kerri Black is a 35 y.o. G1P0000 at 54w2dwho presented for IOL due to PROM.  S: Patient is doing well. Feeling mild contractions and breathing through them. Partner at bedside. No other concerns.   O:  BP 129/78   Pulse 87   Temp 99.1 F (37.3 C) (Oral)   Resp 16   Ht '5\' 8"'$  (1.727 m)   Wt 104.1 kg   LMP 12/10/2019   BMI 34.90 kg/m   EFM: Baseline 150 bpm/min-mod variability/+accels/variable decels   CVE: Dilation: 1 Effacement (%): 40 Station: -3 Presentation: Vertex (by [place] Exam by:: Dr. DCy Blamer  A&P: 35y.o. G1P0000 352w2d #Labor: Cook's catheter remains in place. 2nd dose of Cytotec given. Will reassess in 4 hours.  #Pain: PRN; maternal support at this time #FWB: Cat 2 due to intermittent variable decelerations. Reassuring variability and accels present. Will continue to monitor closely.  #GBS negative #Anticipated MOD: NSVD   ChGenia DelMD 10:55 PM

## 2020-09-10 NOTE — MAU Note (Addendum)
During the night was having abd pains.lost the mucous plug, then the Water broke at 0700, clear fluid, still coming- has seen some lt greenish. No bleeding.

## 2020-09-10 NOTE — H&P (Addendum)
OBSTETRIC ADMISSION HISTORY AND PHYSICAL  Kerri Black is a 35 y.o. female G1P0000 with IUP at 61w2dby LMP presenting for PROM. She reports +FMs, No LOF, no VB, no blurry vision, headaches or peripheral edema, and RUQ pain.  She plans on breast and bottle feeding. She is planning for natural family planning for birth control and is also considering epidural eventually . She received her prenatal care at  [facility] --->  Estimated Date of Delivery: 09/15/20  Sono:   '@[redacted]w[redacted]d'$ , CWD, normal anatomy, cephalic presentation  Per [place] [facility] on 7.25.22 fundal placental location, 1970g, 80% EFW  Prenatal History/Complications: none  Past Medical History: Past Medical History:  Diagnosis Date   Hepatitis    hep b per pt   Uterine fibroid in pregnancy    Past Surgical History: History reviewed. No pertinent surgical history.  Obstetrical History: OB History     Gravida  1   Para  0   Term  0   Preterm  0   AB  0   Living  0      SAB  0   IAB  0   Ectopic  0   Multiple  0   Live Births  0           Social History Social History   Socioeconomic History   Marital status: Married    Spouse name: Not on file   Number of children: Not on file   Years of education: Not on file   Highest education level: Not on file  Occupational History   Not on file  Tobacco Use   Smoking status: Never   Smokeless tobacco: Never  Vaping Use   Vaping Use: Never used  Substance and Sexual Activity   Alcohol use: Never   Drug use: Never   Sexual activity: Yes    Birth control/protection: None  Other Topics Concern   Not on file  Social History Narrative   Not on file   Social Determinants of Health   Financial Resource Strain: Not on file  Food Insecurity: Not on file  Transportation Needs: Not on file  Physical Activity: Not on file  Stress: Not on file  Social Connections: Not on file    Family History: History reviewed. No  pertinent family history.  Allergies: No Known Allergies  Medications Prior to Admission  Medication Sig Dispense Refill Last Dose   Prenatal Vit-Fe Fumarate-FA (PRENATAL VITAMIN PO) Take by mouth.   09/10/2020   Review of Systems   All systems reviewed and negative except as stated in HPI  Blood pressure (!) 162/69, pulse 95, temperature 99.1 F (37.3 C), temperature source Oral, resp. rate 16, height '5\' 8"'$  (1.727 m), weight 104.1 kg, last menstrual period 12/10/2019. General appearance: alert, cooperative, and no distress Abdomen: soft, non-tender; Presentation: cephalic Fetal monitoring: Baseline: 140 bpm, Variability: Good {> 6 bpm), Accelerations: positive, and Decelerations: Absent Uterine activity: Frequency: Every 2-3 minutes and Duration: 30 seconds Dilation: Fingertip Effacement (%): 20 Station: -3 Exam by:: Dr. DCy Blamer  Prenatal labs: ABO, Rh: --/--/B POS (09/09 1200) Antibody: NEG (09/09 1200) Rubella: 7.88 (04/29 1120) RPR: Non Reactive (06/24 0952)  HBsAg: Positive (06/24 0952), had further hepatitis testing significant for Hep B DNA undetectable viral load , Hep B core ab +, Hep B core IgM, Hep E ag, and Hep E ab negative.   HIV: Non Reactive (06/24 0TA:[id number]  GBS: Negative/-- (08/19 1023)  2 hr  Glucola 79/149/136 (normal) Genetic screening  declined Anatomy US normal  Prenatal Transfer Tool  Maternal Diabetes: No Genetic Screening: Declined Maternal Ultrasounds/Referrals: Normal Fetal Ultrasounds or other Referrals:  None Maternal Substance Abuse:  No Significant Maternal Medications:  None Significant Maternal Lab Results: Group B Strep negative and HBsAG positive  Results for orders placed or performed during the hospital encounter of 09/10/20 (from the past 24 hour(s))  POCT fern test   Collection Time: 09/10/20 11:18 AM  Result Value Ref Range   POCT Fern Test Positive = ruptured amniotic membanes   CBC   Collection Time: 09/10/20 11:59 AM  Result  Value Ref Range   WBC 7.6 4.0 - 10.5 K/uL   RBC 4.00 3.87 - 5.11 MIL/uL   Hemoglobin 10.8 (L) 12.0 - 15.0 g/dL   HCT 34.1 (L) 36.0 - 46.0 %   MCV 85.3 80.0 - 100.0 fL   MCH 27.0 26.0 - 34.0 pg   MCHC 31.7 30.0 - 36.0 g/dL   RDW 15.3 11.5 - 15.5 %   Platelets 327 150 - 400 K/uL   nRBC 0.0 0.0 - 0.2 %  Type and screen [place]   Collection Time: 09/10/20 12:00 PM  Result Value Ref Range   ABO/RH(D) B POS    Antibody Screen NEG    Sample Expiration      09/13/2020,2359 Performed at [facility], [address] [place], [place] [address]     Patient Active Problem List   Diagnosis Date Noted   Indication for care in labor or delivery 09/10/2020   Hepatitis B affecting pregnancy 06/27/2020   Encounter for supervision of normal pregnancy, antepartum 04/23/2020    Assessment/Plan:  Kerri Black is a 35 y.o. female G1P0000 with IUP at 69w2dby LMP presenting for SROM initially with clear fluid, then with some meconium..   #Labor: Started on Cytotec x1  #Pain: Prefers un-medicated birth. Open to epidural if needs. #FWB: Cat 1 #ID:  GBS negative, HBsAg positive,(see plan below) #MOF: Breast and bottle feeding  #MOC:  planning natural family planning #Circ:  Yes   #Hep B  Hep B s ag positive, hep B core ab +, hep B core IgM negative Hep B DNA undetectable hEP B e ag and ab negative LFTs: normal AST 12 and Alt 10 on 06/25/2020 Plan for q322monthFTs (last done 06/25/2020) and HBV viral load (last done 07/09/2020) - Will get LFTs now and she will need HBV viral load in October  AnRenard MatterMD  09/10/2020, 4:46 PM   AnRenard MatterMD, MPH OB Fellow, Faculty Practice

## 2020-09-10 NOTE — Progress Notes (Signed)
Kerri Black is a 35 y.o. G1P0000 at 43w2dadmitted for induction of labor due to PROM.  Subjective: Patient reports she feels ok. Would be ok with balloon placement  Objective: BP 129/78   Pulse 87   Temp 99.1 F (37.3 C) (Oral)   Resp 16   Ht '5\' 8"'$  (1.727 m)   Wt 104.1 kg   LMP 12/10/2019   BMI 34.90 kg/m  No intake/output data recorded. No intake/output data recorded.  FHT:  FHR: 150 bpm, variability: moderate,  accelerations:  Present,  decelerations:  Absent UC:   irregular SVE:   Dilation: 1 Effacement (%): 40 Station: -3 Exam by:: Dr. DCy Blamer Labs: Lab Results  Component Value Date   WBC 7.6 09/10/2020   HGB 10.8 (L) 09/10/2020   HCT 34.1 (L) 09/10/2020   MCV 85.3 09/10/2020   PLT 327 09/10/2020    Assessment / Plan: Induction of labor due to PROM  Labor:  s/p cytotecx1, balloon placed, will plan for cytotec#2  Fetal Wellbeing:  Category I Pain Control:  Labor support without medications I/D:   GBS negative Anticipated MOD:  NSVD  ARenard Matter9/09/2020, 7:06 PM

## 2020-09-11 ENCOUNTER — Inpatient Hospital Stay (HOSPITAL_COMMUNITY): Payer: Medicaid Other | Admitting: Anesthesiology

## 2020-09-11 ENCOUNTER — Encounter (HOSPITAL_COMMUNITY): Payer: Self-pay | Admitting: Obstetrics & Gynecology

## 2020-09-11 ENCOUNTER — Encounter (HOSPITAL_COMMUNITY)
Admission: AD | Disposition: A | Discharge status: Home / Self Care | Payer: Self-pay | Source: Home / Self Care | Attending: Family Medicine

## 2020-09-11 DIAGNOSIS — R58 Hemorrhage, not elsewhere classified: Secondary | ICD-10-CM

## 2020-09-11 DIAGNOSIS — Z98891 History of uterine scar from previous surgery: Secondary | ICD-10-CM

## 2020-09-11 DIAGNOSIS — O411230 Chorioamnionitis, third trimester, not applicable or unspecified: Secondary | ICD-10-CM

## 2020-09-11 DIAGNOSIS — O4202 Full-term premature rupture of membranes, onset of labor within 24 hours of rupture: Secondary | ICD-10-CM

## 2020-09-11 DIAGNOSIS — Z3A39 39 weeks gestation of pregnancy: Secondary | ICD-10-CM

## 2020-09-11 LAB — CBC
HCT: 25.6 % — ABNORMAL LOW (ref 36.0–46.0)
Hemoglobin: 8.4 g/dL — ABNORMAL LOW (ref 12.0–15.0)
MCH: 28.1 pg (ref 26.0–34.0)
MCHC: 32.8 g/dL (ref 30.0–36.0)
MCV: 85.6 fL (ref 80.0–100.0)
Platelets: 255 10*3/uL (ref 150–400)
RBC: 2.99 MIL/uL — ABNORMAL LOW (ref 3.87–5.11)
RDW: 15.2 % (ref 11.5–15.5)
WBC: 18.1 10*3/uL — ABNORMAL HIGH (ref 4.0–10.5)
nRBC: 0.1 % (ref 0.0–0.2)

## 2020-09-11 LAB — DIC (DISSEMINATED INTRAVASCULAR COAGULATION)PANEL
D-Dimer, Quant: 10.13 ug/mL-FEU — ABNORMAL HIGH (ref 0.00–0.50)
Fibrinogen: 378 mg/dL (ref 210–475)
INR: 1.1 (ref 0.8–1.2)
Platelets: 257 10*3/uL (ref 150–400)
Prothrombin Time: 13.8 seconds (ref 11.4–15.2)
Smear Review: NONE SEEN
aPTT: 29 seconds (ref 24–36)

## 2020-09-11 LAB — ABO/RH: ABO/RH(D): B POS

## 2020-09-11 LAB — RPR: RPR Ser Ql: NONREACTIVE

## 2020-09-11 LAB — POSTPARTUM HEMORRHAGE PROTOCOL (BB NOTIFICATION)

## 2020-09-11 SURGERY — Surgical Case
Anesthesia: Epidural | Wound class: Clean Contaminated

## 2020-09-11 MED ORDER — KETOROLAC TROMETHAMINE 30 MG/ML IJ SOLN
30.0000 mg | Freq: Four times a day (QID) | INTRAMUSCULAR | Status: AC | PRN
  Administered 2020-09-11: 30 mg via INTRAVENOUS
  Filled 2020-09-11: qty 1

## 2020-09-11 MED ORDER — DIPHENHYDRAMINE HCL 50 MG/ML IJ SOLN
12.5000 mg | INTRAMUSCULAR | Status: DC | PRN
Start: 2020-09-11 — End: 2020-09-11

## 2020-09-11 MED ORDER — FENTANYL-BUPIVACAINE-NACL 0.5-0.125-0.9 MG/250ML-% EP SOLN
12.0000 mL/h | EPIDURAL | Status: DC | PRN
Start: 2020-09-11 — End: 2020-09-11

## 2020-09-11 MED ORDER — INFLUENZA VAC SPLIT QUAD 0.5 ML IM SUSY
0.5000 mL | PREFILLED_SYRINGE | INTRAMUSCULAR | Status: DC
  Filled 2020-09-11 (×2): qty 0.5

## 2020-09-11 MED ORDER — BUPIVACAINE HCL (PF) 0.25 % IJ SOLN
INTRAMUSCULAR | Status: AC
  Filled 2020-09-11: qty 30

## 2020-09-11 MED ORDER — NALBUPHINE HCL 10 MG/ML IJ SOLN
5.0000 mg | INTRAMUSCULAR | Status: DC | PRN
  Administered 2020-09-11 – 2020-09-12 (×2): 5 mg via INTRAVENOUS
  Filled 2020-09-11 (×3): qty 1

## 2020-09-11 MED ORDER — SODIUM CHLORIDE 0.9 % IV SOLN
INTRAVENOUS | Status: AC
  Filled 2020-09-11: qty 2000

## 2020-09-11 MED ORDER — PRENATAL MULTIVITAMIN CH
1.0000 | ORAL_TABLET | Freq: Every day | ORAL | Status: DC
  Administered 2020-09-12 – 2020-09-13 (×2): 1 via ORAL
  Filled 2020-09-11 (×2): qty 1

## 2020-09-11 MED ORDER — DEXMEDETOMIDINE (PRECEDEX) IN NS 20 MCG/5ML (4 MCG/ML) IV SYRINGE
PREFILLED_SYRINGE | INTRAVENOUS | Status: DC | PRN
  Administered 2020-09-11: 4 ug via INTRAVENOUS

## 2020-09-11 MED ORDER — NALBUPHINE HCL 10 MG/ML IJ SOLN: 5.0000 mg | Freq: Once | INTRAMUSCULAR | Status: AC | PRN

## 2020-09-11 MED ORDER — LIDOCAINE-EPINEPHRINE (PF) 2 %-1:200000 IJ SOLN
INTRAMUSCULAR | Status: DC | PRN
  Administered 2020-09-11 (×2): 5 mL via EPIDURAL

## 2020-09-11 MED ORDER — SENNOSIDES-DOCUSATE SODIUM 8.6-50 MG PO TABS
2.0000 | ORAL_TABLET | Freq: Every day | ORAL | Status: DC
  Administered 2020-09-12 – 2020-09-13 (×2): 2 via ORAL
  Filled 2020-09-11 (×2): qty 2

## 2020-09-11 MED ORDER — SODIUM CHLORIDE 0.9 % IV SOLN
INTRAVENOUS | Status: AC
  Filled 2020-09-11: qty 500

## 2020-09-11 MED ORDER — PHENYLEPHRINE 40 MCG/ML (10ML) SYRINGE FOR IV PUSH (FOR BLOOD PRESSURE SUPPORT)
PREFILLED_SYRINGE | INTRAVENOUS | Status: AC
  Filled 2020-09-11: qty 20

## 2020-09-11 MED ORDER — PHENYLEPHRINE 40 MCG/ML (10ML) SYRINGE FOR IV PUSH (FOR BLOOD PRESSURE SUPPORT)
80.0000 ug | PREFILLED_SYRINGE | INTRAVENOUS | Status: DC | PRN
Start: 2020-09-11 — End: 2020-09-11
  Administered 2020-09-11: 80 ug via INTRAVENOUS

## 2020-09-11 MED ORDER — ALBUMIN HUMAN 5 % IV SOLN: INTRAVENOUS | Status: DC | PRN

## 2020-09-11 MED ORDER — SODIUM CHLORIDE 0.9% FLUSH: 3.0000 mL | INTRAVENOUS | Status: DC | PRN

## 2020-09-11 MED ORDER — SCOPOLAMINE 1 MG/3DAYS TD PT72
1.0000 | MEDICATED_PATCH | Freq: Once | TRANSDERMAL | Status: DC
  Administered 2020-09-11: 1.5 mg via TRANSDERMAL
  Filled 2020-09-11: qty 1

## 2020-09-11 MED ORDER — LIDOCAINE HCL (PF) 1 % IJ SOLN
INTRAMUSCULAR | Status: DC | PRN
  Administered 2020-09-11: 5 mL via EPIDURAL
  Administered 2020-09-11: 4 mL via EPIDURAL

## 2020-09-11 MED ORDER — PHENYLEPHRINE 40 MCG/ML (10ML) SYRINGE FOR IV PUSH (FOR BLOOD PRESSURE SUPPORT)
PREFILLED_SYRINGE | INTRAVENOUS | Status: AC
  Filled 2020-09-11: qty 10

## 2020-09-11 MED ORDER — ACETAMINOPHEN 500 MG PO TABS
1000.0000 mg | ORAL_TABLET | Freq: Four times a day (QID) | ORAL | Status: DC
  Administered 2020-09-11 – 2020-09-13 (×7): 1000 mg via ORAL
  Filled 2020-09-11 (×8): qty 2

## 2020-09-11 MED ORDER — ZOLPIDEM TARTRATE 5 MG PO TABS: 5.0000 mg | ORAL_TABLET | Freq: Every evening | ORAL | Status: DC | PRN

## 2020-09-11 MED ORDER — LACTATED RINGERS AMNIOINFUSION: INTRAVENOUS | Status: DC

## 2020-09-11 MED ORDER — ACETAMINOPHEN 500 MG PO TABS
1000.0000 mg | ORAL_TABLET | Freq: Four times a day (QID) | ORAL | Status: DC | PRN
  Administered 2020-09-11: 1000 mg via ORAL
  Filled 2020-09-11: qty 2

## 2020-09-11 MED ORDER — SODIUM CHLORIDE 0.9 % IV SOLN
500.0000 mg | Freq: Once | INTRAVENOUS | Status: AC
  Administered 2020-09-11: 500 mg via INTRAVENOUS
  Filled 2020-09-11: qty 500

## 2020-09-11 MED ORDER — MEPERIDINE HCL 25 MG/ML IJ SOLN: 6.2500 mg | INTRAMUSCULAR | Status: DC | PRN

## 2020-09-11 MED ORDER — PHENYLEPHRINE 40 MCG/ML (10ML) SYRINGE FOR IV PUSH (FOR BLOOD PRESSURE SUPPORT): 80.0000 ug | PREFILLED_SYRINGE | INTRAVENOUS | Status: DC | PRN

## 2020-09-11 MED ORDER — EPHEDRINE 5 MG/ML INJ
10.0000 mg | INTRAVENOUS | Status: DC | PRN
Start: 2020-09-11 — End: 2020-09-11

## 2020-09-11 MED ORDER — GENTAMICIN SULFATE 40 MG/ML IJ SOLN
400.0000 mg | INTRAVENOUS | Status: DC
  Administered 2020-09-11: 400 mg via INTRAVENOUS
  Filled 2020-09-11: qty 10

## 2020-09-11 MED ORDER — TERBUTALINE SULFATE 1 MG/ML IJ SOLN: 0.2500 mg | Freq: Once | INTRAMUSCULAR | Status: DC | PRN

## 2020-09-11 MED ORDER — ONDANSETRON HCL 4 MG/2ML IJ SOLN: 4.0000 mg | Freq: Three times a day (TID) | INTRAMUSCULAR | Status: DC | PRN

## 2020-09-11 MED ORDER — FENTANYL CITRATE (PF) 100 MCG/2ML IJ SOLN
INTRAMUSCULAR | Status: DC | PRN
  Administered 2020-09-11 (×2): 50 ug via INTRAVENOUS

## 2020-09-11 MED ORDER — SIMETHICONE 80 MG PO CHEW: 80.0000 mg | CHEWABLE_TABLET | ORAL | Status: DC | PRN

## 2020-09-11 MED ORDER — MORPHINE SULFATE (PF) 0.5 MG/ML IJ SOLN
INTRAMUSCULAR | Status: AC
  Filled 2020-09-11: qty 10

## 2020-09-11 MED ORDER — OXYTOCIN-SODIUM CHLORIDE 30-0.9 UT/500ML-% IV SOLN
INTRAVENOUS | Status: DC | PRN
Start: 2020-09-11 — End: 2020-09-11
  Administered 2020-09-11 (×2): 30 [IU] via INTRAVENOUS

## 2020-09-11 MED ORDER — SODIUM CHLORIDE 0.9 % IV SOLN
INTRAVENOUS | Status: DC | PRN
  Administered 2020-09-11: 40 ug via INTRAVENOUS
  Administered 2020-09-11: 80 ug via INTRAVENOUS
  Administered 2020-09-11: 40 ug via INTRAVENOUS

## 2020-09-11 MED ORDER — SODIUM CHLORIDE 0.9 % IV SOLN
2.0000 g | Freq: Four times a day (QID) | INTRAVENOUS | Status: DC
  Administered 2020-09-11: 2 g via INTRAVENOUS

## 2020-09-11 MED ORDER — FENTANYL CITRATE (PF) 100 MCG/2ML IJ SOLN: 100.0000 ug | INTRAMUSCULAR | Status: DC | PRN

## 2020-09-11 MED ORDER — NALBUPHINE HCL 10 MG/ML IJ SOLN
5.0000 mg | Freq: Once | INTRAMUSCULAR | Status: AC | PRN
Start: 2020-09-11 — End: 2020-09-11
  Administered 2020-09-11: 5 mg via INTRAVENOUS

## 2020-09-11 MED ORDER — OXYTOCIN-SODIUM CHLORIDE 30-0.9 UT/500ML-% IV SOLN
2.5000 [IU]/h | INTRAVENOUS | Status: AC
  Administered 2020-09-11: 2.5 [IU]/h via INTRAVENOUS
  Filled 2020-09-11: qty 500

## 2020-09-11 MED ORDER — KETOROLAC TROMETHAMINE 30 MG/ML IJ SOLN: 30.0000 mg | Freq: Four times a day (QID) | INTRAMUSCULAR | Status: AC | PRN

## 2020-09-11 MED ORDER — DIPHENHYDRAMINE HCL 50 MG/ML IJ SOLN
12.5000 mg | INTRAMUSCULAR | Status: DC | PRN
  Administered 2020-09-11: 12.5 mg via INTRAVENOUS
  Filled 2020-09-11: qty 1

## 2020-09-11 MED ORDER — ONDANSETRON HCL 4 MG/2ML IJ SOLN
INTRAMUSCULAR | Status: DC | PRN
  Administered 2020-09-11: 4 mg via INTRAVENOUS

## 2020-09-11 MED ORDER — SODIUM CHLORIDE 0.9 % IR SOLN
Status: DC | PRN
  Administered 2020-09-11: 1

## 2020-09-11 MED ORDER — DIBUCAINE (PERIANAL) 1 % EX OINT: 1.0000 "application " | TOPICAL_OINTMENT | CUTANEOUS | Status: DC | PRN

## 2020-09-11 MED ORDER — NALBUPHINE HCL 10 MG/ML IJ SOLN
5.0000 mg | INTRAMUSCULAR | Status: DC | PRN
Start: 2020-09-11 — End: 2020-09-13

## 2020-09-11 MED ORDER — LACTATED RINGERS IV SOLN: 500.0000 mL | Freq: Once | INTRAVENOUS | Status: DC

## 2020-09-11 MED ORDER — TETANUS-DIPHTH-ACELL PERTUSSIS 5-2.5-18.5 LF-MCG/0.5 IM SUSY: 0.5000 mL | PREFILLED_SYRINGE | Freq: Once | INTRAMUSCULAR | Status: DC

## 2020-09-11 MED ORDER — FENTANYL-BUPIVACAINE-NACL 0.5-0.125-0.9 MG/250ML-% EP SOLN
EPIDURAL | Status: DC | PRN
  Administered 2020-09-11: 12 mL/h via EPIDURAL

## 2020-09-11 MED ORDER — LACTATED RINGERS IV SOLN: INTRAVENOUS | Status: DC

## 2020-09-11 MED ORDER — COCONUT OIL OIL: 1.0000 "application " | TOPICAL_OIL | Status: DC | PRN

## 2020-09-11 MED ORDER — WITCH HAZEL-GLYCERIN EX PADS: 1.0000 "application " | MEDICATED_PAD | CUTANEOUS | Status: DC | PRN

## 2020-09-11 MED ORDER — EPHEDRINE 5 MG/ML INJ: 10.0000 mg | INTRAVENOUS | Status: DC | PRN

## 2020-09-11 MED ORDER — DIPHENHYDRAMINE HCL 25 MG PO CAPS
25.0000 mg | ORAL_CAPSULE | ORAL | Status: DC | PRN
  Administered 2020-09-12 (×3): 25 mg via ORAL
  Filled 2020-09-11 (×3): qty 1

## 2020-09-11 MED ORDER — NALOXONE HCL 4 MG/10ML IJ SOLN
1.0000 ug/kg/h | INTRAVENOUS | Status: DC | PRN
  Filled 2020-09-11: qty 5

## 2020-09-11 MED ORDER — PHENYLEPHRINE HCL-NACL 20-0.9 MG/250ML-% IV SOLN
INTRAVENOUS | Status: DC | PRN
  Administered 2020-09-11: 60 ug/min via INTRAVENOUS

## 2020-09-11 MED ORDER — LACTATED RINGERS IV BOLUS
1000.0000 mL | Freq: Once | INTRAVENOUS | Status: AC
  Administered 2020-09-11: 1000 mL via INTRAVENOUS

## 2020-09-11 MED ORDER — MORPHINE SULFATE (PF) 0.5 MG/ML IJ SOLN
INTRAMUSCULAR | Status: DC | PRN
  Administered 2020-09-11: 3 mg via EPIDURAL

## 2020-09-11 MED ORDER — FENTANYL CITRATE (PF) 100 MCG/2ML IJ SOLN
INTRAMUSCULAR | Status: AC
  Filled 2020-09-11: qty 2

## 2020-09-11 MED ORDER — FENTANYL-BUPIVACAINE-NACL 0.5-0.125-0.9 MG/250ML-% EP SOLN
EPIDURAL | Status: AC
  Filled 2020-09-11: qty 250

## 2020-09-11 MED ORDER — DIPHENHYDRAMINE HCL 25 MG PO CAPS: 25.0000 mg | ORAL_CAPSULE | Freq: Four times a day (QID) | ORAL | Status: DC | PRN

## 2020-09-11 MED ORDER — OXYCODONE HCL 5 MG PO TABS
5.0000 mg | ORAL_TABLET | ORAL | Status: DC | PRN
  Administered 2020-09-12 – 2020-09-13 (×5): 5 mg via ORAL
  Filled 2020-09-11 (×5): qty 1

## 2020-09-11 MED ORDER — MENTHOL 3 MG MT LOZG: 1.0000 | LOZENGE | OROMUCOSAL | Status: DC | PRN

## 2020-09-11 MED ORDER — LACTATED RINGERS IV BOLUS: 1000.0000 mL | Freq: Once | INTRAVENOUS | Status: DC

## 2020-09-11 MED ORDER — SIMETHICONE 80 MG PO CHEW
80.0000 mg | CHEWABLE_TABLET | Freq: Three times a day (TID) | ORAL | Status: DC
  Administered 2020-09-12 – 2020-09-13 (×3): 80 mg via ORAL
  Filled 2020-09-11 (×4): qty 1

## 2020-09-11 MED ORDER — OXYTOCIN-SODIUM CHLORIDE 30-0.9 UT/500ML-% IV SOLN
INTRAVENOUS | Status: AC
  Filled 2020-09-11: qty 500

## 2020-09-11 MED ORDER — OXYTOCIN-SODIUM CHLORIDE 30-0.9 UT/500ML-% IV SOLN
1.0000 m[IU]/min | INTRAVENOUS | Status: DC
  Administered 2020-09-11: 1 m[IU]/min via INTRAVENOUS

## 2020-09-11 MED ORDER — NALOXONE HCL 0.4 MG/ML IJ SOLN: 0.4000 mg | INTRAMUSCULAR | Status: DC | PRN

## 2020-09-11 MED ORDER — FENTANYL CITRATE (PF) 100 MCG/2ML IJ SOLN
INTRAMUSCULAR | Status: AC
  Administered 2020-09-11: 100 ug via INTRAVENOUS
  Filled 2020-09-11: qty 2

## 2020-09-11 MED ORDER — BUPIVACAINE HCL 0.25 % IJ SOLN
INTRAMUSCULAR | Status: DC | PRN
  Administered 2020-09-11: 30 mL

## 2020-09-11 SURGICAL SUPPLY — 32 items
BENZOIN TINCTURE PRP APPL 2/3 (GAUZE/BANDAGES/DRESSINGS) ×2 IMPLANT
CLAMP CORD UMBIL (MISCELLANEOUS) ×2 IMPLANT
CLOSURE STERI STRIP 1/2 X4 (GAUZE/BANDAGES/DRESSINGS) ×2 IMPLANT
CLOTH BEACON ORANGE TIMEOUT ST (SAFETY) ×2 IMPLANT
DRSG OPSITE POSTOP 4X10 (GAUZE/BANDAGES/DRESSINGS) ×2 IMPLANT
ELECT REM PT RETURN 9FT ADLT (ELECTROSURGICAL) ×2
ELECTRODE REM PT RTRN 9FT ADLT (ELECTROSURGICAL) ×1 IMPLANT
EXTRACTOR VACUUM M CUP 4 TUBE (SUCTIONS) IMPLANT
GAUZE SPONGE 4X4 12PLY STRL LF (GAUZE/BANDAGES/DRESSINGS) ×4 IMPLANT
GLOVE BIOGEL PI IND STRL 7.0 (GLOVE) ×3 IMPLANT
GLOVE BIOGEL PI INDICATOR 7.0 (GLOVE) ×3
GLOVE ECLIPSE 7.0 STRL STRAW (GLOVE) ×2 IMPLANT
GOWN STRL REUS W/TWL LRG LVL3 (GOWN DISPOSABLE) ×4 IMPLANT
KIT ABG SYR 3ML LUER SLIP (SYRINGE) ×2 IMPLANT
NEEDLE HYPO 22GX1.5 SAFETY (NEEDLE) ×2 IMPLANT
NEEDLE HYPO 25X5/8 SAFETYGLIDE (NEEDLE) ×2 IMPLANT
NS IRRIG 1000ML POUR BTL (IV SOLUTION) ×2 IMPLANT
PACK C SECTION WH (CUSTOM PROCEDURE TRAY) ×2 IMPLANT
PAD ABD 7.5X8 STRL (GAUZE/BANDAGES/DRESSINGS) ×2 IMPLANT
PAD OB MATERNITY 4.3X12.25 (PERSONAL CARE ITEMS) ×2 IMPLANT
PENCIL SMOKE EVAC W/HOLSTER (ELECTROSURGICAL) ×2 IMPLANT
RTRCTR C-SECT PINK 25CM LRG (MISCELLANEOUS) ×2 IMPLANT
STRIP CLOSURE SKIN 1/2X4 (GAUZE/BANDAGES/DRESSINGS) ×2 IMPLANT
SUT MNCRL 0 VIOLET CTX 36 (SUTURE) ×2 IMPLANT
SUT MONOCRYL 0 CTX 36 (SUTURE) ×2
SUT VIC AB 0 CTX 36 (SUTURE) ×1
SUT VIC AB 0 CTX36XBRD ANBCTRL (SUTURE) ×1 IMPLANT
SUT VIC AB 4-0 KS 27 (SUTURE) ×2 IMPLANT
SYR 30ML LL (SYRINGE) ×2 IMPLANT
TOWEL OR 17X24 6PK STRL BLUE (TOWEL DISPOSABLE) ×2 IMPLANT
TRAY FOLEY W/BAG SLVR 14FR LF (SET/KITS/TRAYS/PACK) ×2 IMPLANT
WATER STERILE IRR 1000ML POUR (IV SOLUTION) ×2 IMPLANT

## 2020-09-11 NOTE — Anesthesia Procedure Notes (Signed)
Epidural Patient location during procedure: OB Start time: 09/11/2020 1:32 AM End time: 09/11/2020 1:41 AM  Staffing Anesthesiologist: Josephine Igo, MD Performed: anesthesiologist   Preanesthetic Checklist Completed: patient identified, IV checked, site marked, risks and benefits discussed, surgical consent, monitors and equipment checked, pre-op evaluation and timeout performed  Epidural Patient position: sitting Prep: DuraPrep and site prepped and draped Patient monitoring: continuous pulse ox and blood pressure Approach: midline Location: L3-L4 Injection technique: LOR air  Needle:  Needle type: Tuohy  Needle gauge: 17 G Needle length: 9 cm and 9 Needle insertion depth: 5 cm Catheter type: closed end flexible Catheter size: 19 Gauge Catheter at skin depth: 10 cm Test dose: negative and Other  Assessment Events: blood not aspirated, injection not painful, no injection resistance, no paresthesia and negative IV test  Additional Notes Patient identified. Risks and benefits discussed including failed block, incomplete  Pain control, post dural puncture headache, nerve damage, paralysis, blood pressure Changes, nausea, vomiting, reactions to medications-both toxic and allergic and post Partum back pain. All questions were answered. Patient expressed understanding and wished to proceed. Sterile technique was used throughout procedure. Epidural site was Dressed with sterile barrier dressing. No paresthesias, signs of intravascular injection Or signs of intrathecal spread were encountered.  Patient was more comfortable after the epidural was dosed. Please see RN's note for documentation of vital signs and FHR which are stable. Reason for block:procedure for pain

## 2020-09-11 NOTE — Progress Notes (Signed)
Pt requesting epidural catheter to be removed. Per Dr. Gwenlyn Perking no need from [facility] standpoint for catheter to remain, to check with anesthesia. Called Dr. Eligha Bridegroom and okay to have removed.

## 2020-09-11 NOTE — Anesthesia Preprocedure Evaluation (Signed)
Anesthesia Evaluation  Patient identified by MRN, date of birth, ID band Patient awake    Reviewed: Allergy & Precautions, Patient's Chart, lab work & pertinent test results  Airway Mallampati: II  TM Distance: >3 FB Neck ROM: Full    Dental no notable dental hx. (+) Teeth Intact   Pulmonary neg pulmonary ROS,    Pulmonary exam normal breath sounds clear to auscultation       Cardiovascular negative cardio ROS Normal cardiovascular exam Rhythm:Regular Rate:Normal     Neuro/Psych negative neurological ROS  negative psych ROS   GI/Hepatic negative GI ROS, (+) Hepatitis -, BNormal LFT's   Endo/Other  Obesity  Renal/GU negative Renal ROS  negative genitourinary   Musculoskeletal negative musculoskeletal ROS (+)   Abdominal (+) + obese,   Peds  Hematology  (+) anemia ,   Anesthesia Other Findings   Reproductive/Obstetrics (+) Pregnancy                             Anesthesia Physical Anesthesia Plan  ASA: 2  Anesthesia Plan: Epidural   Post-op Pain Management:    Induction:   PONV Risk Score and Plan:   Airway Management Planned: Natural Airway  Additional Equipment:   Intra-op Plan:   Post-operative Plan:   Informed Consent: I have reviewed the patients History and Physical, chart, labs and discussed the procedure including the risks, benefits and alternatives for the proposed anesthesia with the patient or authorized representative who has indicated his/her understanding and acceptance.       Plan Discussed with: Anesthesiologist  Anesthesia Plan Comments:         Anesthesia Quick Evaluation

## 2020-09-11 NOTE — Progress Notes (Signed)
Kerri Black is a 36 y.o. G1P0000 at 63w3dadmitted for induction of labor due to PROM with meconium.  Overnight patient had decelerations and tachysystole after cytotec and needed to get terbutaline. She has since been having contractions spontaneously but her cervix has not made change in > 12 hours. Her tracing has been significant for intermittent late decelerations with contractions, with periods of minimal variability. There were signs of recovery with periods of spontaneous accelerations as well as moderate variability.   Patient was given option of trialing pitocin at very low dose to assist with making contractions adequate or to opt for a cesarean section and with shared decision making patient initially opted to trial pitocin augmentation.  However, when pitocin was started only at 1cc rate, there were recurrent late and variable decelerations and periods of minimal variability.    Discussed the findings and our recommendation at that time and patient agreed to a cesarean section for fetal intolerance of labor while remote from delivery.   ARenard Matter MD, MPH OB Fellow, Faculty Practice

## 2020-09-11 NOTE — Discharge Summary (Addendum)
Postpartum Discharge Summary    Patient Name: Kerri Black DOB: [birth year] MRN: [id number]  Date of admission: 09/10/2020 Delivery date:09/11/2020  Delivering provider: Donnamae Jude  Date of discharge: 09/13/2020  Admitting diagnosis: Indication for care in labor or delivery [O75.9] Intrauterine pregnancy: [redacted]w[redacted]d     Secondary diagnosis:  Active Problems:   Indication for care in labor or delivery   S/P cesarean section   Hemorrhage  Additional problems: None    Discharge diagnosis: Term Pregnancy Delivered                                              Post partum procedures:IV venofer Augmentation: Pitocin and Cytotec Complications: Intrauterine Inflammation or infection (Chorioamniotis) and Hemorrhage>1065mL  Hospital course: Onset of Labor With Unplanned C/S   35 y.o. yo G1P1001 at [redacted]w[redacted]d was admitted in PROM  on 09/10/2020. Patient had a labor course significant for fetal intolerance of labor and arrest of dilation as well as chorioamnionitis. The patient went for cesarean section due to Non-Reassuring FHR and remote from delivery . Delivery details as follows: Membrane Rupture Time/Date: 7:00 AM ,09/10/2020   Delivery Method:C-Section, Low Transverse  Details of operation can be found in separate operative note. Patient had an  postpartum course significant for acute blood loss anemia (EBL 2100cc), her HgB dropped from 10.8>7.7 and she received IV Venofer. She was asymptomatic on POD#1 and 2 and otherwise had an uncomplicated post op course.  She is ambulating,tolerating a regular diet, passing flatus, and urinating well.  Patient is discharged home in stable condition 09/13/20.  Newborn Data: Birth date:09/11/2020  Birth time:1:52 PM  Gender:Female  Living status:Living  Apgars:8 ,9  Weight:7 lb 3.9 oz (3.285 kg)   Magnesium Sulfate received: No BMZ received: No Rhophylac:No MMR:No T-DaP:Given prenatally Flu: No Transfusion:No  Physical exam  Vitals:   09/12/20 0901  09/12/20 1300 09/12/20 2345 09/13/20 0526  BP: 116/80 120/81 120/71 122/79  Pulse: 97 97 96 94  Resp: $Remo'20 20 20 20  'jEgqh$ Temp: 97.9 F (36.6 C) 97.7 F (36.5 C) 98.3 F (36.8 C) 98.3 F (36.8 C)  TempSrc:  Axillary Oral Oral  SpO2: 98% 100% 100% 100%  Weight:      Height:       General: alert, cooperative, and no distress Lochia: appropriate Uterine Fundus: firm Incision: Healing well with no significant drainage DVT Evaluation: No evidence of DVT seen on physical exam. Labs: Lab Results  Component Value Date   WBC 20.1 (H) 09/12/2020   HGB 7.7 (L) 09/12/2020   HCT 23.6 (L) 09/12/2020   MCV 84.9 09/12/2020   PLT 262 09/12/2020   CMP Latest Ref Rng & Units 09/10/2020  Glucose 70 - 99 mg/dL 89  BUN 6 - 20 mg/dL 6  Creatinine 0.44 - 1.00 mg/dL 0.73  Sodium 135 - 145 mmol/L 134(L)  Potassium 3.5 - 5.1 mmol/L 4.2  Chloride 98 - 111 mmol/L 108  CO2 22 - 32 mmol/L 19(L)  Calcium 8.9 - 10.3 mg/dL 9.0  Total Protein 6.5 - 8.1 g/dL 5.6(L)  Total Bilirubin 0.3 - 1.2 mg/dL 0.4  Alkaline Phos 38 - 126 U/L 167(H)  AST 15 - 41 U/L 19  ALT 0 - 44 U/L 17   Edinburgh Score: Edinburgh Postnatal Depression Scale Screening Tool 09/13/2020  I have been able to laugh and see the funny side of things. 0  I have looked forward with enjoyment to things. 0  I have blamed myself unnecessarily when things went wrong. 1  I have been anxious or worried for no good reason. 0  I have felt scared or panicky for no good reason. 0  Things have been getting on top of me. 0  I have been so unhappy that I have had difficulty sleeping. 0  I have felt sad or miserable. 0  I have been so unhappy that I have been crying. 0  The thought of harming myself has occurred to me. 0  Edinburgh Postnatal Depression Scale Total 1     After visit meds:  Allergies as of 09/13/2020   No Known Allergies      Medication List     TAKE these medications    acetaminophen 500 MG tablet Commonly known as:  TYLENOL Take 2 tablets (1,000 mg total) by mouth every 6 (six) hours.   ferrous sulfate 325 (65 FE) MG tablet Take 1 tablet (325 mg total) by mouth every other day.   oxyCODONE 5 MG immediate release tablet Commonly known as: Oxy IR/ROXICODONE Take 1-2 tablets (5-10 mg total) by mouth every 4 (four) hours as needed for moderate pain.   PRENATAL VITAMIN PO Take by mouth.               Discharge Care Instructions  (From admission, onward)           Start     Ordered   09/13/20 0000  Discharge wound care:       Comments: Patient to remove honecomb dressing 5 days after her surgery.   09/13/20 1201             Discharge home in stable condition Infant Feeding: Breast Infant Disposition:home with mother Discharge instruction: per After Visit Summary and Postpartum booklet. Activity: Advance as tolerated. Pelvic rest for 6 weeks.  Diet: routine diet Future Appointments: Future Appointments  Date Time Provider [place]  09/21/2020 10:20 AM [place] None  10/19/2020 10:15 AM [place] Mode, MD [facility] None   Follow up Visit: Message sent to [facility] on 9/10 by Dr. Cy Blamer  Please schedule this patient for a In person postpartum visit in 4 weeks with the following provider: MD. Additional Postpartum F/U:Incision check 1 week  Low risk pregnancy complicated by:  Triple I And PPH (2156mL) Delivery mode:  C-Section, Low Transverse  Anticipated Birth Control:   Planning natural family [place] PGY1  09/13/2020 Renard Matter, MD

## 2020-09-11 NOTE — Progress Notes (Signed)
Pharmacy Antibiotic Note  Kerri Black is a 35 y.o. female 39wk3 day gestation in labor delivery, developed fever to start ampicillin and gentamicin for chorioamnionitis. Pharmacy has been consulted for gentamicin dosing. Est. Crcl > 130m/min  Plan: Gentamicin '400mg'$  ('5mg'$ /kg) IV Q 24 hrs F/u clinical progress and duration of treatment.  Height: '5\' 8"'$  (172.7 cm) Weight: 104.1 kg (229 lb 8 oz) IBW/kg (Calculated) : 63.9 Gentamicin dosing weight = 80 kg  Temp (24hrs), Avg:98.7 F (37.1 C), Min:97.8 F (36.6 C), Max:100.5 F (38.1 C)  Recent Labs  Lab 09/10/20 1159  WBC 7.6  CREATININE 0.73    Estimated Creatinine Clearance: 127.5 mL/min (by C-G formula based on SCr of 0.73 mg/dL).    No Known Allergies  Antimicrobials this admission: Ampicillin 2g IV Q 6 9/10 >> Gentamicin '400mg'$  Q 24 hrs 9/10 >>  Dose adjustments this admission:   Microbiology results:   Thank you for allowing pharmacy to be a part of this patient's care.  MMaryanna Shape PharmD, BCPS, BCPPS Clinical Pharmacist  Pager: [phone] 09/11/2020 11:43 AM

## 2020-09-11 NOTE — Transfer of Care (Signed)
Immediate Anesthesia Transfer of Care Note  Patient: Kerri Black  Procedure(s) Performed: CESAREAN SECTION  Patient Location: PACU  Anesthesia Type:Epidural  Level of Consciousness: awake, alert  and oriented  Airway & Oxygen Therapy: Patient Spontanous Breathing  Post-op Assessment: Report given to RN and Post -op Vital signs reviewed and stable  Post vital signs: Reviewed and stable  Last Vitals:  Vitals Value Taken Time  BP 106/83 09/11/20 1530  Temp 37 C 09/11/20 1530  Pulse 116 09/11/20 1534  Resp 21 09/11/20 1534  SpO2 87 % 09/11/20 1534  Vitals shown include unvalidated device data.  Last Pain:  Vitals:   09/11/20 1530  TempSrc:   PainSc: 0-No pain         Complications: No notable events documented.

## 2020-09-11 NOTE — Progress Notes (Signed)
Labor Progress Note Kerri Black is a 35 y.o. G1P0000 at 74w3dwho presented for IOL due to PROM.  S: Resting comfortably, no concerns.   O:  BP 110/80   Pulse (!) 119   Temp 97.8 F (36.6 C) (Axillary)   Resp 16   Ht '5\' 8"'$  (1.727 m)   Wt 104.1 kg   LMP 12/10/2019   BMI 34.90 kg/m   EFM: Baseline 150 bpm/moderate variability/no accels/late decels  CVE: Dilation: 6 Effacement (%): 70 Cervical Position: Middle Station: -2 Presentation: Vertex Exam by:: SConan Bowens RN   A&P: 35y.o. G1P0000 336w3d #Labor: SVE similar to prior exam. IUPC placed in order to start amnioinfusion for late and variable decelerations. Terbutaline given. Will reassess over the next 30 minutes. Will hold off on any augmentation at this time.  #Pain: Epidural  #FWB: Cat 2 due to late decels. Amnioinfusion and terbutaline now. Will closely monitor.  #GBS negative  ChGenia DelMD 4:05 AM

## 2020-09-11 NOTE — Op Note (Signed)
Preoperative Diagnosis:  IUP @ [redacted]w[redacted]d PROM, Arrest of dilation, Fetal Intolerance of Labor, Chorioamnionitis  Postoperative Diagnosis:  Same  Procedure: Primary low transverse cesarean section  Surgeon: TDarron Doom M.D.  Assistant: None  Anesthesia: Epidural with OJaneece Riggers MD  Findings: Viable female infant, APGAR (1 MIN): 8   APGAR (5 MINS): 9     Estimated blood loss: 2100cc Input: 2000cc fluid, 250 of Albumin Output: [id number]  Complications: Hemorrhage 2100cc Specimens: Placenta to labor and delivery  Findings: multiple fibroids appreciated at fundus as well as near hysterotomy site  Procedure: Patient is a to the OR where epidural analgesia was administered. She was then placed in a supine position with left lateral tilt. She received '500mg'$  IV Azithromycin and already had received Ampicillin/Gentamycin for chorioamnionitis prior to delivery.  and SCDs were in place. She was prepped and draped in the usual sterile fashion. A timeout was performed. A knife was then used to make a Pfannenstiel incision. This incision was carried out to underlying fascia which was divided in the midline with the knife. The incision was extended laterally, sharply. The fascia was dissected of the underlying rectus superiorly.  The rectus was divided in the midline.  The peritoneal cavity was entered bluntly.  Alexis retractor was placed inside the incision.  A knife was used to make a low transverse incision on the uterus. This incision was carried down to the amniotic cavity was entered. Fetus was in direct occiput posterior position and was brought up out of the incision. Cord was clamped x 2 and cut. Infant taken to waiting pediatrician.  Cord blood was obtained. Placenta was delivered from the uterus.  Uterus was cleaned with dry lap pads.   There was significant bleeding from the hysterotomy site and was repaired with 0 Vicryl suture in a locked running fashion and an imbricating layer of  suture was done which led to appropriate hemostasis. Alexis retractor was removed from the abdomen. Peritoneal closure was done with 0 Vicryl suture in pursestring fashion. Fascia is closed with 0 Vicryl suture in a running fashion.  Skin closed using 3-0 Vicryl on a Keith needle.  Steri strips applied, followed by pressure dressing.  All instrument, needle and lap counts were correct x 2.  Patient was awake and taken to PACU.   Due to her significant hemorrhage the hemorrhage blood labs were sent and will be followed up.   ARenard Matter MD, MPH OB Fellow [facility]

## 2020-09-11 NOTE — Lactation Note (Signed)
This note was copied from a baby's chart. Lactation Consultation Note  Patient Name: Kerri Black [id number] Date: 09/11/2020 Reason for consult: Initial assessment;Primapara;1st time breastfeeding;Term Age:33 hours  LC in to room for initial consult. Mother seems a little drowsy but eager to latch. Father is present and supportive. Demonstrated hand expression and spoonfed a few drops of colostrum. "Kerri Black" latched at once and continue breastfeeding upon [place] leaving room.  Reviewed normal newborn behavior during first 24h, expected output, tummy size and feeding frequency.   Plan: 1-Skin to skin, aim for a deep, comfortable latch and breastfeed on demand or 8-12 times in 24h period. 2-Encouraged maternal rest, hydration and food intake.  3-Contact LC as needed for feeds/support/concerns/questions   All questions answered at this time. Provided Lactation services brochure and promoted [employer] booklet information.    Maternal Data Has patient been taught Hand Expression?: Yes Does the patient have breastfeeding experience prior to this delivery?: No  LATCH Score Latch: Grasps breast easily, tongue down, lips flanged, rhythmical sucking.  Audible Swallowing: Spontaneous and intermittent  Type of Nipple: Everted at rest and after stimulation  Comfort (Breast/Nipple): Soft / non-tender  Hold (Positioning): Assistance needed to correctly position infant at breast and maintain latch.  LATCH Score: 9  Interventions Interventions: Breast feeding basics reviewed;Assisted with latch;Skin to skin;Hand express;Breast massage;Adjust position;Support pillows;Expressed milk;Education  Discharge Pump: Personal [place] Program: Yes  Consult Status Consult Status: Follow-up Date: 09/12/20 Follow-up type: In-patient    Anitra Doxtater A Higuera Ancidey 09/11/2020, 7:23 PM

## 2020-09-11 NOTE — Progress Notes (Signed)
Patient seen and examined. IUPC replaced. SVE: 5/80/-2 FHR alternates between category 2 and category 3. Given some phenylephrine to help with BP.  Discussed that if fetal heart rate continues to have issues and we cannot start pitocin to help get her into labor, may need to move to abdominal delivery due to fetal intolerance of labor.

## 2020-09-12 ENCOUNTER — Encounter (HOSPITAL_COMMUNITY): Payer: Self-pay | Admitting: Family Medicine

## 2020-09-12 LAB — CBC
HCT: 23.6 % — ABNORMAL LOW (ref 36.0–46.0)
Hemoglobin: 7.7 g/dL — ABNORMAL LOW (ref 12.0–15.0)
MCH: 27.7 pg (ref 26.0–34.0)
MCHC: 32.6 g/dL (ref 30.0–36.0)
MCV: 84.9 fL (ref 80.0–100.0)
Platelets: 262 10*3/uL (ref 150–400)
RBC: 2.78 MIL/uL — ABNORMAL LOW (ref 3.87–5.11)
RDW: 15.3 % (ref 11.5–15.5)
WBC: 20.1 10*3/uL — ABNORMAL HIGH (ref 4.0–10.5)
nRBC: 0.1 % (ref 0.0–0.2)

## 2020-09-12 MED ORDER — SODIUM CHLORIDE 0.9 % IV SOLN
500.0000 mg | Freq: Once | INTRAVENOUS | Status: AC
  Administered 2020-09-12: 500 mg via INTRAVENOUS
  Filled 2020-09-12: qty 25

## 2020-09-12 NOTE — Anesthesia Postprocedure Evaluation (Signed)
Anesthesia Post Note  Patient: Kerri Black  Procedure(s) Performed: Bernie     Patient location during evaluation: Mother Baby Anesthesia Type: Epidural Level of consciousness: awake and alert Pain management: pain level controlled Vital Signs Assessment: post-procedure vital signs reviewed and stable Respiratory status: spontaneous breathing, nonlabored ventilation and respiratory function stable Cardiovascular status: stable Postop Assessment: no headache, no backache and epidural receding Anesthetic complications: no   No notable events documented.  Last Vitals:  Vitals:   09/12/20 0901 09/12/20 1300  BP: 116/80 120/81  Pulse: 97 97  Resp: 20 20  Temp: 36.6 C 36.5 C  SpO2: 98% 100%    Last Pain:  Vitals:   09/12/20 1600  TempSrc:   PainSc: 0-No pain                 Baleria Wyman

## 2020-09-12 NOTE — Progress Notes (Signed)
Post Operative Day 1 Subjective: Patient reports she is doing well this morning. She is very itchy and is requesting something to help with this. She is having intermittent cramping that is controlled with PRN medications. Her bleeding is appropriate. She is ambulating without significant lightheadedness or dizziness. She is eating and drinking well. Her foley catheter was recently removed this morning and she has not yet voided since then. She is unsure if she has passed flatus. She is breastfeeding which is going well.  Objective: Blood pressure 116/80, pulse 97, temperature 97.9 F (36.6 C), resp. rate 20, height '5\' 8"'$  ([id number] m), weight 104.1 kg, last menstrual period 12/10/2019, SpO2 98 %, unknown if currently breastfeeding.  Physical Exam:  General: alert, cooperative, and no distress Lochia: appropriate Uterine Fundus: firm Incision: healing well, no significant drainage, no significant erythema, mild strike through over honeycomb, pressure dressing in place DVT Evaluation: no LE edema or calf tenderness to palpation  Skin: no rashes or lesions noted  Recent Labs    09/11/20 1503 09/12/20 0456  HGB 8.4* 7.7*  HCT 25.6* 23.6*    Assessment/Plan: Corinne Kubena is a 35 year old G1P1001 on POD#1 s/p pLTCS.  Patient is doing well overall and meeting postpartum milestones. VSS.   Acute blood loss anemia:  - Hgb 10.8 > 8.4 > 7.7 this AM - Asymptomatic  - Will give IV Venofer  Itching:  - Explained to patient that this is often a side effect from anesthesia - No rashes or lesions noted on exam - Continue Nubain and Benadryl PRN  Feeding: Breast  Contraception: Natural family planning  Circ: Desires; needs to be consented   Dispo: Plan for discharge PPD#2    LOS: 2 days   Genia Del, MD 09/12/2020, 9:50 AM

## 2020-09-13 MED ORDER — FERROUS SULFATE 325 (65 FE) MG PO TABS: 325.0000 mg | ORAL_TABLET | ORAL | 0 refills | Status: AC

## 2020-09-13 MED ORDER — ACETAMINOPHEN 500 MG PO TABS: 1000.0000 mg | ORAL_TABLET | Freq: Four times a day (QID) | ORAL | 0 refills | Status: DC

## 2020-09-13 MED ORDER — OXYCODONE HCL 5 MG PO TABS: 5.0000 mg | ORAL_TABLET | ORAL | 0 refills | Status: DC | PRN

## 2020-09-13 NOTE — Lactation Note (Addendum)
This note was copied from a baby's chart. Lactation Consultation Note  Patient Name: Kerri Black [id number] Date: 09/13/2020 Reason for consult: Follow-up assessment;Mother's request;Term;Nipple pain/trauma Age:33 hours  Mom stated latch painful at the beginning. We reviewed latching infant on abdomen in laid back position to get more depth. The nipples are round, no compression stripe, abrasions or trauma noted.   Infant had circ today. Infant not voided or stooled since last night 23:00 We talked about breast compression during feeding and listening for audible swallows. Mom pumping with dEBP not getting any drops of colostrum.  Plan 1 To feed based on cues 8-12x 24 hr period. Mom to offer breast with compression and listen for swallows.  2. Mom to supplement with EBM first followed by formula with yellow flow flow nipple and pace bottle feeding. BF supplementation guide provided based on hrs of age since delivery.  3. Mom to pump with DEBP q 3hrs for 86mn 4. [place] with Mom supplementing after each feeding for now   Mom follow up appt with Pediatrician in the morning.  All questions answered at the end of the visit.   Maternal Data    Feeding Mother's Current Feeding Choice: Breast Milk and Formula  LATCH Score                    Lactation Tools Discussed/Used Tools: Pump;Flanges Flange Size: 24 Breast pump type: Double-Electric Breast Pump Pump Education: Setup, frequency, and cleaning;Milk Storage Reason for Pumping: increase stimulation Pumping frequency: every 3 hrs for 118m  Interventions Interventions: Breast feeding basics reviewed;Breast compression;DEBP;Education;Pace feeding  Discharge Discharge Education: Engorgement and breast care;Warning signs for feeding baby Pump: Personal  Consult Status Consult Status: Follow-up Date: 09/14/20 Follow-up type: In-patient    Davonna Ertl  Nicholson-Springer 09/13/2020, 4:10 PM

## 2020-09-14 LAB — SURGICAL PATHOLOGY

## 2020-09-21 ENCOUNTER — Ambulatory Visit (INDEPENDENT_AMBULATORY_CARE_PROVIDER_SITE_OTHER): Payer: Self-pay

## 2020-09-21 ENCOUNTER — Other Ambulatory Visit: Payer: Self-pay

## 2020-09-21 VITALS — BP 122/79 | HR 99

## 2020-09-21 DIAGNOSIS — L7682 Other postprocedural complications of skin and subcutaneous tissue: Secondary | ICD-10-CM

## 2020-09-21 DIAGNOSIS — Z4889 Encounter for other specified surgical aftercare: Secondary | ICD-10-CM

## 2020-09-21 DIAGNOSIS — Z98891 History of uterine scar from previous surgery: Secondary | ICD-10-CM

## 2020-09-21 MED ORDER — IBUPROFEN 800 MG PO TABS: 800.0000 mg | ORAL_TABLET | Freq: Three times a day (TID) | ORAL | 3 refills | Status: DC | PRN

## 2020-09-21 NOTE — Progress Notes (Signed)
The wound is cleansed, debrided of foreign material as much as possible, and dressed. The patient is alerted to watch for any signs of infection (redness, pus, pain, increased swelling or fever) and call if such occurs. Home wound care instructions are provided. Tetanus vaccination status reviewed: last tetanus booster within 10 years.   Patient reports having some pain on the right side. Patient request for more pain medication. 800 mg ibuprofen sent to the pharmacy per Dr. Roselie Awkward.

## 2020-09-25 ENCOUNTER — Telehealth (HOSPITAL_COMMUNITY): Payer: Self-pay | Admitting: *Deleted

## 2020-09-25 NOTE — Telephone Encounter (Signed)
Attempted Hospital Discharge Follow-Up Call.  Left voice mail requesting that patient return RN's phone call.  

## 2020-10-19 ENCOUNTER — Encounter: Payer: Self-pay | Admitting: Obstetrics & Gynecology

## 2020-10-19 ENCOUNTER — Other Ambulatory Visit: Payer: Self-pay

## 2020-10-19 ENCOUNTER — Ambulatory Visit (INDEPENDENT_AMBULATORY_CARE_PROVIDER_SITE_OTHER): Payer: Self-pay | Admitting: Obstetrics & Gynecology

## 2020-10-19 VITALS — BP 129/86 | HR 67 | Ht 67.0 in | Wt 215.0 lb

## 2020-10-19 DIAGNOSIS — Z98891 History of uterine scar from previous surgery: Secondary | ICD-10-CM

## 2020-10-19 DIAGNOSIS — M25561 Pain in right knee: Secondary | ICD-10-CM

## 2020-10-19 DIAGNOSIS — M25562 Pain in left knee: Secondary | ICD-10-CM

## 2020-10-19 NOTE — Progress Notes (Signed)
[place] Partum Visit Note  Kerri Black is a 35 y.o. G55P1001 female who presents for a postpartum visit. She is 5 weeks postpartum following a primary cesarean section.  I have fully reviewed the prenatal and intrapartum course. The delivery was at 39.3 gestational weeks.  Anesthesia: epidural. Postpartum course has been unremarkable. Baby is doing well. Baby is feeding by breast. Bleeding staining only. Bowel function is normal. Bladder function is normal. Patient is not sexually active. Contraception method is none. Postpartum depression screening: negative.   Upstream - 10/19/20 1028       Pregnancy Intention Screening   Does the patient want to become pregnant in the next year? No    Does the patient's partner want to become pregnant in the next year? No    Would the patient like to discuss contraceptive options today? No      Contraception Wrap Up   Current Method No Contraceptive Precautions            The pregnancy intention screening data noted above was reviewed. Potential methods of contraception were discussed. The patient elected to proceed with No data recorded.   Edinburgh Postnatal Depression Scale - 10/19/20 1027       Edinburgh Postnatal Depression Scale:  In the Past 7 Days   I have been able to laugh and see the funny side of things. 0    I have looked forward with enjoyment to things. 0    I have blamed myself unnecessarily when things went wrong. 0    I have been anxious or worried for no good reason. 0    I have felt scared or panicky for no good reason. 0    Things have been getting on top of me. 0    I have been so unhappy that I have had difficulty sleeping. 0    I have felt sad or miserable. 0    I have been so unhappy that I have been crying. 0    The thought of harming myself has occurred to me. 0    Edinburgh Postnatal Depression Scale Total 0             Health Maintenance Due  Topic Date Due   COVID-19 Vaccine (1) Never done    INFLUENZA VACCINE  Never done    The following portions of the patient's history were reviewed and updated as appropriate: allergies, current medications, past family history, past medical history, past social history, past surgical history, and problem list.  Review of Systems Musculoskeletal:positive for arthralgias and both knees and CTS sx left hand  Objective:  BP 129/86   Pulse 67   Ht 5\' 7"  (1.702 m)   Wt 215 lb (97.5 kg)   LMP 12/10/2019   Breastfeeding Yes   BMI 33.67 kg/m    General:  alert, cooperative, and no distress   Breasts:  not indicated  Lungs: Effort normal  Heart:  regular rate and rhythm  Abdomen: soft, non-tender; bowel sounds normal; no masses,  no organomegaly   Wound well approximated incision  GU exam:  not indicated       Assessment:    There are no diagnoses linked to this encounter.  Normal postpartum exam.   Plan:   Essential components of care per ACOG recommendations:  1.  Mood and well being: Patient with negative depression screening today. Reviewed local resources for support.  - Patient tobacco use? No.   - hx of drug use? No.  2. Infant care and feeding:  -Patient currently breastmilk feeding?   -Social determinants of health (SDOH) reviewed in EPIC. No concerns 3. Sexuality, contraception and birth spacing - Patient does not want a pregnancy in the next year.  Desired family size is 2 children.  - Reviewed forms of contraception in tiered fashion. Patient desired natural family planning (NFP) today.   - Discussed birth spacing of 18 months  4. Sleep and fatigue -Encouraged family/partner/community support of 4 hrs of uninterrupted sleep to help with mood and fatigue  5. Physical Recovery  - Discussed patients delivery and complications. She describes her labor as good. - Patient had a C-section. - Patient has urinary incontinence? No. - Patient is safe to resume physical and sexual activity  6.  Health Maintenance - HM  due items addressed Yes - Last pap smear  Diagnosis  Date Value Ref Range Status  04/30/2020   Final   - Negative for intraepithelial lesion or malignancy (NILM)   Pap smear not done at today's visit.  -Breast Cancer screening indicated? No.   7. Chronic Disease/Pregnancy Condition follow up:  fibroids  Sports medicine referral for knee pain  Center for [employer], [facility]   Woodroe Mode, MD 10/19/2020

## 2020-10-22 ENCOUNTER — Ambulatory Visit: Payer: Self-pay | Admitting: Family Medicine

## 2021-11-03 ENCOUNTER — Encounter (HOSPITAL_COMMUNITY): Payer: Self-pay | Admitting: *Deleted

## 2021-11-03 ENCOUNTER — Ambulatory Visit (HOSPITAL_COMMUNITY)
Admission: EM | Admit: 2021-11-03 | Discharge: 2021-11-03 | Disposition: A | Discharge status: Home / Self Care | Payer: Medicaid Other | Attending: Sports Medicine | Admitting: Sports Medicine

## 2021-11-03 DIAGNOSIS — N3001 Acute cystitis with hematuria: Secondary | ICD-10-CM | POA: Diagnosis present

## 2021-11-03 LAB — POCT URINALYSIS DIPSTICK, ED / UC
Bilirubin Urine: NEGATIVE
Glucose, UA: NEGATIVE mg/dL
Ketones, ur: NEGATIVE mg/dL
Nitrite: NEGATIVE
Protein, ur: 100 mg/dL — AB
Specific Gravity, Urine: 1.02 (ref 1.005–1.030)
Urobilinogen, UA: 0.2 mg/dL (ref 0.0–1.0)
pH: 7 (ref 5.0–8.0)

## 2021-11-03 LAB — POC URINE PREG, ED: Preg Test, Ur: NEGATIVE

## 2021-11-03 MED ORDER — NITROFURANTOIN MONOHYD MACRO 100 MG PO CAPS: 100.0000 mg | ORAL_CAPSULE | Freq: Two times a day (BID) | ORAL | 0 refills | Status: DC

## 2021-11-03 NOTE — ED Provider Notes (Signed)
[facility]    CSN: [id number] Arrival date & time: 11/03/21  1012      History   Chief Complaint Chief Complaint  Patient presents with   Urinary Frequency    HPI Kerri Black is a 36 y.o. female.   She presents today with her husband and son with chief complaint of dysuria and urinary frequency for the past 4 days.  She has also been noticing malodorous and cloudy urine.  She denies any fevers, chills, abdominal pain or vaginal discharge.  Of note she has not had a menstrual cycle since her child was born 68 months ago.  Of note she was breast-feeding her child up until 5 months ago.  She reports dependency test at home yesterday that was negative.  She has had 1 urinary tract infection in the past that she states just gave her an uncomfortable feeling but not a burning sensation.   Urinary Frequency    Past Medical History:  Diagnosis Date   Hepatitis    hep b per pt   Uterine fibroid in pregnancy     Patient Active Problem List   Diagnosis Date Noted   S/P cesarean section 09/11/2020    Past Surgical History:  Procedure Laterality Date   CESAREAN SECTION  09/11/2020   Procedure: CESAREAN SECTION;  Surgeon: Donnamae Jude, MD;  Location: [facility] LD ORS;  Service: Obstetrics;;    OB History     Gravida  1   Para  1   Term  1   Preterm  0   AB  0   Living  1      SAB  0   IAB  0   Ectopic  0   Multiple  0   Live Births  1            Home Medications    Prior to Admission medications   Medication Sig Start Date End Date Taking? Authorizing Provider  nitrofurantoin, macrocrystal-monohydrate, (MACROBID) 100 MG capsule Take 1 capsule (100 mg total) by mouth 2 (two) times daily. 11/03/21  Yes Rodena Goldmann A, DO  Prenatal Vit-Fe Fumarate-FA (PRENATAL VITAMIN PO) Take by mouth.   Yes [provider]  ferrous sulfate 325 (65 FE) MG tablet Take 1 tablet (325 mg total) by mouth every other day. 09/13/20 11/12/20   Eppie Gibson, MD    Family History History reviewed. No pertinent family history.  Social History Social History   Tobacco Use   Smoking status: Never   Smokeless tobacco: Never  Vaping Use   Vaping Use: Never used  Substance Use Topics   Alcohol use: Never   Drug use: Never     Allergies   Patient has no known allergies.   Review of Systems Review of Systems  Genitourinary:  Positive for frequency.  As listed above in HPI   Physical Exam Triage Vital Signs ED Triage Vitals [11/03/21 1127]  Enc Vitals Group     BP (!) 129/90     Pulse Rate 81     Resp 18     Temp (!) 97.5 F (36.4 C)     Temp Source Oral     SpO2      Weight      Height      Head Circumference      Peak Flow      Pain Score      Pain Loc      Pain Edu?  Excl. in [place]?    No data found.  Updated Vital Signs BP (!) 129/90 (BP Location: Right Arm)   Pulse 81   Temp (!) 97.5 F (36.4 C) (Oral)   Resp 18   Breastfeeding No   Physical Exam Vitals reviewed.  Constitutional:      General: She is not in acute distress.    Appearance: Normal appearance. She is not ill-appearing, toxic-appearing or diaphoretic.  Cardiovascular:     Rate and Rhythm: Normal rate.  Pulmonary:     Effort: Pulmonary effort is normal.  Abdominal:     General: Abdomen is flat.     Palpations: There is mass (midline, firm uterine mass).     Tenderness: There is abdominal tenderness. There is no right CVA tenderness or left CVA tenderness.     Comments: Suprapubic tenderness  Neurological:     Mental Status: She is alert.      UC Treatments / Results  Labs (all labs ordered are listed, but only abnormal results are displayed) Labs Reviewed  POCT URINALYSIS DIPSTICK, ED / UC - Abnormal; Notable for the following components:      Result Value   Hgb urine dipstick MODERATE (*)    Protein, ur 100 (*)    Leukocytes,Ua LARGE (*)    All other components within normal limits  URINE CULTURE  POC  URINE PREG, ED    EKG  Radiology No results found.  Procedures Procedures (including critical care time)  Medications Ordered in UC Medications - No data to display  Initial Impression / Assessment and Plan / UC Course  I have reviewed the triage vital signs and the nursing notes.  Pertinent labs & imaging results that were available during my care of the patient were reviewed by me and considered in my medical decision making (see chart for details).    Acute cystitis with hematuria.  Macrobid twice daily for 5 days sent to her pharmacy. UA, positive leukocytes, negative nitrites, positive for blood. Urine culture pending, urgent care staff will call with results.  I anticipate hematuria is from the cystitis.  She should follow-up with primary care provider after she has completed treatment and is feeling better for repeat UA. UPT negative Final Clinical Impressions(s) / UC Diagnoses   Final diagnoses:  Acute cystitis with hematuria     Discharge Instructions      Your urine studies today were suspicious for urinary tract infection.  I have sent an antibiotic to your pharmacy.  Macrobid 1 tablet twice a day for 5 days.  I have also sent urine culture.  If we need to switch your antibiotic urgent care staff will call you with these results.  Stay well-hydrated as you take these medications.  If you continue to have symptoms or they worsen or do not improve with the medication return to urgent care for follow-up or your primary care provider. Your pregnancy test today was negative     ED Prescriptions     Medication Sig Dispense Auth. Provider   nitrofurantoin, macrocrystal-monohydrate, (MACROBID) 100 MG capsule Take 1 capsule (100 mg total) by mouth 2 (two) times daily. 10 capsule Rodena Goldmann A, DO      PDMP not reviewed this encounter.   Elmore Guise, DO 11/03/21 1202

## 2021-11-03 NOTE — Discharge Instructions (Addendum)
Your urine studies today were suspicious for urinary tract infection.  I have sent an antibiotic to your pharmacy.  Macrobid 1 tablet twice a day for 5 days.  I have also sent urine culture.  If we need to switch your antibiotic urgent care staff will call you with these results.  Stay well-hydrated as you take these medications.  If you continue to have symptoms or they worsen or do not improve with the medication return to urgent care for follow-up or your primary care provider. Your pregnancy test today was negative

## 2021-11-03 NOTE — ED Triage Notes (Signed)
Pt states that 4 days ago she started with urine frequency, Dysuria and cloudy urine. She did take pregnancy test yesterday. She states she hasn't had a menstrual cycle since she had her son he is 14 months.

## 2021-11-05 LAB — URINE CULTURE: Culture: 50000 — AB

## 2021-11-19 ENCOUNTER — Encounter (HOSPITAL_COMMUNITY): Payer: Self-pay

## 2021-11-19 ENCOUNTER — Ambulatory Visit (HOSPITAL_COMMUNITY)
Admission: EM | Admit: 2021-11-19 | Discharge: 2021-11-19 | Disposition: A | Discharge status: Home / Self Care | Payer: Medicaid Other | Attending: Physician Assistant | Admitting: Physician Assistant

## 2021-11-19 DIAGNOSIS — N3001 Acute cystitis with hematuria: Secondary | ICD-10-CM | POA: Diagnosis present

## 2021-11-19 DIAGNOSIS — N912 Amenorrhea, unspecified: Secondary | ICD-10-CM | POA: Diagnosis not present

## 2021-11-19 LAB — COMPREHENSIVE METABOLIC PANEL
ALT: 14 U/L (ref 0–44)
AST: 17 U/L (ref 15–41)
Albumin: 4.1 g/dL (ref 3.5–5.0)
Alkaline Phosphatase: 72 U/L (ref 38–126)
Anion gap: 10 (ref 5–15)
BUN: 8 mg/dL (ref 6–20)
CO2: 25 mmol/L (ref 22–32)
Calcium: 10 mg/dL (ref 8.9–10.3)
Chloride: 106 mmol/L (ref 98–111)
Creatinine, Ser: 0.8 mg/dL (ref 0.44–1.00)
GFR, Estimated: >60 mL/min (ref >60)
Glucose, Bld: 74 mg/dL (ref 70–99)
Potassium: 4.2 mmol/L (ref 3.5–5.1)
Sodium: 141 mmol/L (ref 135–145)
Total Bilirubin: 0.8 mg/dL (ref 0.3–1.2)
Total Protein: 7.6 g/dL (ref 6.5–8.1)

## 2021-11-19 LAB — POCT URINALYSIS DIPSTICK, ED / UC
Bilirubin Urine: NEGATIVE
Bilirubin Urine: NEGATIVE
Glucose, UA: NEGATIVE mg/dL
Glucose, UA: NEGATIVE mg/dL
Ketones, ur: NEGATIVE mg/dL
Ketones, ur: NEGATIVE mg/dL
Nitrite: NEGATIVE
Nitrite: NEGATIVE
Protein, ur: 100 mg/dL — AB
Protein, ur: 100 mg/dL — AB
Specific Gravity, Urine: 1.025 (ref 1.005–1.030)
Specific Gravity, Urine: 1.025 (ref 1.005–1.030)
Urobilinogen, UA: 0.2 mg/dL (ref 0.0–1.0)
Urobilinogen, UA: 0.2 mg/dL (ref 0.0–1.0)
pH: 5.5 (ref 5.0–8.0)
pH: 5.5 (ref 5.0–8.0)

## 2021-11-19 LAB — T4, FREE: Free T4: 0.92 ng/dL (ref 0.61–1.12)

## 2021-11-19 LAB — CBC
HCT: 42.7 % (ref 36.0–46.0)
Hemoglobin: 14.5 g/dL (ref 12.0–15.0)
MCH: 29.2 pg (ref 26.0–34.0)
MCHC: 34 g/dL (ref 30.0–36.0)
MCV: 86.1 fL (ref 80.0–100.0)
Platelets: 290 10*3/uL (ref 150–400)
RBC: 4.96 MIL/uL (ref 3.87–5.11)
RDW: 12.7 % (ref 11.5–15.5)
WBC: 7.3 10*3/uL (ref 4.0–10.5)
nRBC: 0 % (ref 0.0–0.2)

## 2021-11-19 LAB — POC URINE PREG, ED: Preg Test, Ur: NEGATIVE

## 2021-11-19 LAB — TSH: TSH: 1.313 u[IU]/mL (ref 0.350–4.500)

## 2021-11-19 LAB — HCG, QUANTITATIVE, PREGNANCY: hCG, Beta Chain, Quant, S: <1 m[IU]/mL (ref <5)

## 2021-11-19 MED ORDER — CIPROFLOXACIN HCL 500 MG PO TABS: 500.0000 mg | ORAL_TABLET | Freq: Two times a day (BID) | ORAL | 0 refills | Status: AC

## 2021-11-19 NOTE — Discharge Instructions (Addendum)
Your urine pregnancy test was negative.  Your urine does look like you have another infection.  Please start ciprofloxacin 500 twice daily for 5 days.  We will contact you if we need to change your antibiotics based on your culture results.  Make sure you rest and drink plenty of fluid.  Given you are having recurrent symptoms I recommend you follow-up with your urologist.  Please call to schedule an appointment.  If you have any worsening symptoms including abdominal pain, pelvic pain, fever, nausea, vomiting you need to be seen immediately.  We have drawn some labs to investigate why you have not had your period.  We will contact you if they are abnormal.  Please call and schedule appoint with OB/GYN.  If you develop any additional symptoms return for reevaluation.

## 2021-11-19 NOTE — ED Triage Notes (Signed)
Pt is here for possible UTI since today

## 2021-11-19 NOTE — ED Provider Notes (Signed)
[place]    CSN: [id number] Arrival date & time: 11/19/21  1232      History   Chief Complaint Chief Complaint  Patient presents with   Dysuria    HPI Kerri Black is a 36 y.o. female.   Patient presents today with a 12-hour history of UTI symptoms.  She reports frequency, urgency, pelvic pressure beginning around 4 AM this morning.  She was seen by clinic on 11/03/2021 at which point she had a UTI and was treated with nitrofurantoin.  Urine culture obtained at that visit showed E. coli sensitive to this medication.  Reports her symptoms did resolve following use of this medication only to recur today.  She denies history of recurrent UTI, single kidney, self-catheterization, recent urogenital procedure.  She has not seen a urologist.  Denies history of diabetes or SGLT2 inhibitor use.  She denies any additional symptoms including fever, abdominal pain, nausea, vomiting.  At the end of visit patient reported that she has not had her menstrual cycle since her son was born 20 months ago.  She did breast-feed him until he was 23 months old but has not been breast-feeding in the past 5 months.  She does have a history of uterine fibroids with enlarged uterus but typically has regular menstrual cycles.  She is not currently followed by OB/GYN.    Past Medical History:  Diagnosis Date   Hepatitis    hep b per pt   Uterine fibroid in pregnancy     Patient Active Problem List   Diagnosis Date Noted   S/P cesarean section 09/11/2020    Past Surgical History:  Procedure Laterality Date   CESAREAN SECTION  09/11/2020   Procedure: CESAREAN SECTION;  Surgeon: Donnamae Jude, MD;  Location: [facility];  Service: Obstetrics;;    OB History     Gravida  1   Para  1   Term  1   Preterm  0   AB  0   Living  1      SAB  0   IAB  0   Ectopic  0   Multiple  0   Live Births  1            Home Medications    Prior to Admission medications    Medication Sig Start Date End Date Taking? Authorizing Provider  ciprofloxacin (CIPRO) 500 MG tablet Take 1 tablet (500 mg total) by mouth every 12 (twelve) hours. 11/19/21  Yes Kimika Streater K, PA-C  ferrous sulfate 325 (65 FE) MG tablet Take 1 tablet (325 mg total) by mouth every other day. 09/13/20 11/12/20  Eppie Gibson, MD  Prenatal Vit-Fe Fumarate-FA (PRENATAL VITAMIN PO) Take by mouth.    [provider]    Family History History reviewed. No pertinent family history.  Social History Social History   Tobacco Use   Smoking status: Never   Smokeless tobacco: Never  Vaping Use   Vaping Use: Never used  Substance Use Topics   Alcohol use: Never   Drug use: Never     Allergies   Patient has no known allergies.   Review of Systems Review of Systems  Constitutional:  Negative for activity change, appetite change, fatigue and fever.  Gastrointestinal:  Negative for abdominal pain, diarrhea, nausea and vomiting.  Genitourinary:  Positive for dysuria, frequency and urgency. Negative for pelvic pain, vaginal bleeding, vaginal discharge and vaginal pain.     Physical Exam Triage Vital Signs  ED Triage Vitals  Enc Vitals Group     BP 11/19/21 1401 126/82     Pulse Rate 11/19/21 1401 71     Resp 11/19/21 1401 12     Temp 11/19/21 1401 98 F (36.7 C)     Temp Source 11/19/21 1401 Oral     SpO2 11/19/21 1401 100 %     Weight --      Height --      Head Circumference --      Peak Flow --      Pain Score 11/19/21 1358 4     Pain Loc --      Pain Edu? --      Excl. in [place]? --    No data found.  Updated Vital Signs BP 126/82 (BP Location: Left Arm)   Pulse 71   Temp 98 F (36.7 C) (Oral)   Resp 12   LMP  (LMP Unknown)   SpO2 100%   Visual Acuity Right Eye Distance:   Left Eye Distance:   Bilateral Distance:    Right Eye Near:   Left Eye Near:    Bilateral Near:     Physical Exam Vitals reviewed.  Constitutional:      General: She is awake.  She is not in acute distress.    Appearance: Normal appearance. She is well-developed. She is not ill-appearing.     Comments: Very pleasant female appears stated age in no acute distress sitting comfortably in exam room  HENT:     Head: Normocephalic and atraumatic.  Cardiovascular:     Rate and Rhythm: Normal rate and regular rhythm.     Heart sounds: Normal heart sounds, S1 normal and S2 normal. No murmur heard. Pulmonary:     Effort: Pulmonary effort is normal.     Breath sounds: Normal breath sounds. No wheezing, rhonchi or rales.     Comments: Clear to auscultation bilaterally Abdominal:     General: Bowel sounds are normal.     Palpations: Abdomen is soft.     Tenderness: There is no abdominal tenderness. There is no right CVA tenderness, left CVA tenderness, guarding or rebound.     Comments: Benign abdominal exam.  Psychiatric:        Behavior: Behavior is cooperative.      UC Treatments / Results  Labs (all labs ordered are listed, but only abnormal results are displayed) Labs Reviewed  POCT URINALYSIS DIPSTICK, ED / UC - Abnormal; Notable for the following components:      Result Value   Hgb urine dipstick MODERATE (*)    Protein, ur 100 (*)    Leukocytes,Ua MODERATE (*)    All other components within normal limits  POCT URINALYSIS DIPSTICK, ED / UC - Abnormal; Notable for the following components:   Hgb urine dipstick LARGE (*)    Protein, ur 100 (*)    Leukocytes,Ua SMALL (*)    All other components within normal limits  URINE CULTURE  COMPREHENSIVE METABOLIC PANEL  CBC  HCG, QUANTITATIVE, PREGNANCY  PROLACTIN  TSH  T4, FREE  POC URINE PREG, ED    EKG   Radiology No results found.  Procedures Procedures (including critical care time)  Medications Ordered in UC Medications - No data to display  Initial Impression / Assessment and Plan / UC Course  I have reviewed the triage vital signs and the nursing notes.  Pertinent labs & imaging results  that were available during my care of the patient  were reviewed by me and considered in my medical decision making (see chart for details).     Patient is well-appearing, afebrile, nontoxic, nontachycardic.  Vital signs and physical exam are reassuring today; no indication for emergent evaluation or imaging.  Urine pregnancy was negative.  UA did show evidence of UTI.  Patient was treated with ciprofloxacin given recent nitrofurantoin use.  Urine culture was obtained and we will contact her if need to change antibiotics based on susceptibilities identified on culture.  Given recurrent symptoms recommend she follow-up with urology.  She was given contact information for local provider with instruction to call to schedule an appointment.  She is to rest and drink plenty of fluid.  Discussed that if she has any worsening symptoms she needs to be seen immediately.  Strict return precautions given.  Work excuse note provided.  Patient reports that she has not had a menstrual cycle since the birth of her son 14 months ago despite not breast-feeding for the past 5 months.  Urine pregnancy was negative at this visit as well as at her previous visit on 11/03/2021.  Basic labs including CBC, CMP, prolactin, TSH, free T4, serum hCG obtained today-results pending.  We will contact her if any of these are abnormal.  Recommended that she follow-up with OB/GYN.  She was given contact information for local provider with instruction to call to schedule an appointment.  Discussed that if she has any changing or worsening symptoms she should return for reevaluation.  Final Clinical Impressions(s) / UC Diagnoses   Final diagnoses:  Acute cystitis with hematuria  Amenorrhea     Discharge Instructions      Your urine pregnancy test was negative.  Your urine does look like you have another infection.  Please start ciprofloxacin 500 twice daily for 5 days.  We will contact you if we need to change your antibiotics based  on your culture results.  Make sure you rest and drink plenty of fluid.  Given you are having recurrent symptoms I recommend you follow-up with your urologist.  Please call to schedule an appointment.  If you have any worsening symptoms including abdominal pain, pelvic pain, fever, nausea, vomiting you need to be seen immediately.  We have drawn some labs to investigate why you have not had your period.  We will contact you if they are abnormal.  Please call and schedule appoint with OB/GYN.  If you develop any additional symptoms return for reevaluation.     ED Prescriptions     Medication Sig Dispense Auth. Provider   ciprofloxacin (CIPRO) 500 MG tablet Take 1 tablet (500 mg total) by mouth every 12 (twelve) hours. 10 tablet Yanna Leaks, Derry Skill, PA-C      PDMP not reviewed this encounter.   Terrilee Croak, PA-C 11/19/21 1450

## 2021-11-20 LAB — URINE CULTURE: Culture: <10000 — AB

## 2021-11-20 LAB — PROLACTIN: Prolactin: 141 ng/mL — ABNORMAL HIGH (ref 4.8–23.3)
